# Patient Record
Sex: Male | Born: 1940 | Race: White | Hispanic: No | Marital: Married | Smoking: Former smoker
Health system: Southern US, Community
[De-identification: ages and names within clinical notes are randomized; demographics above are authoritative.]

## PROBLEM LIST (undated history)

## (undated) DIAGNOSIS — N289 Disorder of kidney and ureter, unspecified: Secondary | ICD-10-CM

## (undated) DIAGNOSIS — J449 Chronic obstructive pulmonary disease, unspecified: Secondary | ICD-10-CM

## (undated) DIAGNOSIS — I10 Essential (primary) hypertension: Secondary | ICD-10-CM

## (undated) DIAGNOSIS — I639 Cerebral infarction, unspecified: Secondary | ICD-10-CM

## (undated) DIAGNOSIS — E119 Type 2 diabetes mellitus without complications: Secondary | ICD-10-CM

## (undated) DIAGNOSIS — H9190 Unspecified hearing loss, unspecified ear: Secondary | ICD-10-CM

## (undated) DIAGNOSIS — I4891 Unspecified atrial fibrillation: Secondary | ICD-10-CM

## (undated) DIAGNOSIS — M1990 Unspecified osteoarthritis, unspecified site: Secondary | ICD-10-CM

## (undated) DIAGNOSIS — K219 Gastro-esophageal reflux disease without esophagitis: Secondary | ICD-10-CM

## (undated) HISTORY — DX: Type 2 diabetes mellitus without complications: E11.9

## (undated) HISTORY — DX: Cerebral infarction, unspecified: I63.9

## (undated) HISTORY — DX: Chronic obstructive pulmonary disease, unspecified: J44.9

## (undated) HISTORY — DX: Disorder of kidney and ureter, unspecified: N28.9

## (undated) HISTORY — DX: Unspecified atrial fibrillation: I48.91

## (undated) HISTORY — PX: FEMORAL HERNIA REPAIR: SHX632

## (undated) HISTORY — DX: Unspecified osteoarthritis, unspecified site: M19.90

---

## 2000-02-20 ENCOUNTER — Encounter: Admission: RE | Admit: 2000-02-20 | Discharge: 2000-03-06 | Payer: Self-pay | Admitting: *Deleted

## 2008-07-10 ENCOUNTER — Ambulatory Visit: Payer: Self-pay | Admitting: Cardiology

## 2008-07-16 ENCOUNTER — Encounter: Payer: Self-pay | Admitting: Cardiology

## 2009-04-15 ENCOUNTER — Encounter: Payer: Self-pay | Admitting: Cardiology

## 2009-04-15 ENCOUNTER — Ambulatory Visit: Payer: Self-pay | Admitting: Cardiology

## 2009-04-16 ENCOUNTER — Encounter: Payer: Self-pay | Admitting: Cardiology

## 2009-04-17 ENCOUNTER — Encounter: Payer: Self-pay | Admitting: Cardiology

## 2009-04-18 ENCOUNTER — Encounter: Payer: Self-pay | Admitting: Cardiology

## 2009-04-19 ENCOUNTER — Encounter: Payer: Self-pay | Admitting: Cardiology

## 2009-04-21 ENCOUNTER — Encounter: Payer: Self-pay | Admitting: Cardiology

## 2009-05-10 ENCOUNTER — Inpatient Hospital Stay (HOSPITAL_COMMUNITY): Admission: AD | Admit: 2009-05-10 | Discharge: 2009-05-17 | Payer: Self-pay | Admitting: Cardiology

## 2009-05-10 ENCOUNTER — Encounter (INDEPENDENT_AMBULATORY_CARE_PROVIDER_SITE_OTHER): Payer: Self-pay | Admitting: *Deleted

## 2009-05-10 ENCOUNTER — Encounter: Payer: Self-pay | Admitting: Nurse Practitioner

## 2009-05-10 ENCOUNTER — Ambulatory Visit: Payer: Self-pay | Admitting: Cardiology

## 2009-05-10 ENCOUNTER — Encounter: Payer: Self-pay | Admitting: Emergency Medicine

## 2009-05-17 ENCOUNTER — Encounter: Payer: Self-pay | Admitting: Nurse Practitioner

## 2009-05-20 ENCOUNTER — Ambulatory Visit: Payer: Self-pay | Admitting: Cardiology

## 2009-05-24 ENCOUNTER — Encounter: Payer: Self-pay | Admitting: Cardiology

## 2009-05-27 ENCOUNTER — Telehealth: Payer: Self-pay | Admitting: Cardiology

## 2009-05-27 ENCOUNTER — Encounter: Payer: Self-pay | Admitting: Cardiology

## 2009-05-27 LAB — CONVERTED CEMR LAB: POC INR: 2.5

## 2009-05-30 ENCOUNTER — Encounter: Payer: Self-pay | Admitting: Cardiology

## 2009-06-03 ENCOUNTER — Encounter: Payer: Self-pay | Admitting: Cardiology

## 2009-06-03 ENCOUNTER — Telehealth: Payer: Self-pay | Admitting: Cardiology

## 2009-06-03 LAB — CONVERTED CEMR LAB: POC INR: 2

## 2009-06-04 ENCOUNTER — Encounter: Payer: Self-pay | Admitting: Cardiology

## 2009-06-10 ENCOUNTER — Encounter (INDEPENDENT_AMBULATORY_CARE_PROVIDER_SITE_OTHER): Payer: Self-pay | Admitting: *Deleted

## 2009-06-10 ENCOUNTER — Ambulatory Visit: Payer: Self-pay | Admitting: Cardiology

## 2009-06-10 ENCOUNTER — Telehealth: Payer: Self-pay | Admitting: Cardiology

## 2009-06-10 ENCOUNTER — Encounter: Payer: Self-pay | Admitting: Cardiology

## 2009-06-10 DIAGNOSIS — E782 Mixed hyperlipidemia: Secondary | ICD-10-CM

## 2009-06-10 DIAGNOSIS — I5022 Chronic systolic (congestive) heart failure: Secondary | ICD-10-CM

## 2009-06-10 DIAGNOSIS — E119 Type 2 diabetes mellitus without complications: Secondary | ICD-10-CM

## 2009-06-10 DIAGNOSIS — I4891 Unspecified atrial fibrillation: Secondary | ICD-10-CM

## 2009-06-10 LAB — CONVERTED CEMR LAB: POC INR: 2.4

## 2009-06-17 ENCOUNTER — Telehealth: Payer: Self-pay | Admitting: Cardiology

## 2009-06-17 ENCOUNTER — Encounter: Payer: Self-pay | Admitting: Cardiology

## 2009-06-21 ENCOUNTER — Telehealth (INDEPENDENT_AMBULATORY_CARE_PROVIDER_SITE_OTHER): Payer: Self-pay | Admitting: *Deleted

## 2009-07-01 ENCOUNTER — Ambulatory Visit: Payer: Self-pay | Admitting: Cardiology

## 2009-07-11 ENCOUNTER — Encounter: Payer: Self-pay | Admitting: Cardiology

## 2009-08-05 ENCOUNTER — Ambulatory Visit: Payer: Self-pay | Admitting: Cardiology

## 2009-08-05 LAB — CONVERTED CEMR LAB: POC INR: 1.9

## 2009-08-25 ENCOUNTER — Encounter (INDEPENDENT_AMBULATORY_CARE_PROVIDER_SITE_OTHER): Payer: Self-pay | Admitting: *Deleted

## 2009-09-15 ENCOUNTER — Encounter (INDEPENDENT_AMBULATORY_CARE_PROVIDER_SITE_OTHER): Payer: Self-pay | Admitting: Pharmacist

## 2009-09-26 ENCOUNTER — Ambulatory Visit: Payer: Self-pay | Admitting: Cardiology

## 2009-09-26 ENCOUNTER — Encounter: Payer: Self-pay | Admitting: Cardiology

## 2009-09-26 DIAGNOSIS — N189 Chronic kidney disease, unspecified: Secondary | ICD-10-CM

## 2009-09-27 ENCOUNTER — Encounter: Payer: Self-pay | Admitting: Cardiology

## 2009-10-10 ENCOUNTER — Encounter: Payer: Self-pay | Admitting: Cardiology

## 2009-10-28 ENCOUNTER — Ambulatory Visit: Payer: Self-pay | Admitting: Cardiology

## 2009-10-28 LAB — CONVERTED CEMR LAB: POC INR: 1.7

## 2009-11-25 ENCOUNTER — Ambulatory Visit: Payer: Self-pay | Admitting: Cardiology

## 2009-12-13 ENCOUNTER — Telehealth (INDEPENDENT_AMBULATORY_CARE_PROVIDER_SITE_OTHER): Payer: Self-pay | Admitting: *Deleted

## 2009-12-16 ENCOUNTER — Ambulatory Visit: Payer: Self-pay | Admitting: Cardiology

## 2009-12-30 ENCOUNTER — Ambulatory Visit: Payer: Self-pay | Admitting: Cardiology

## 2010-01-20 ENCOUNTER — Ambulatory Visit: Payer: Self-pay | Admitting: Cardiology

## 2010-01-20 LAB — CONVERTED CEMR LAB: POC INR: 2.2

## 2010-02-17 ENCOUNTER — Ambulatory Visit: Payer: Self-pay | Admitting: Cardiology

## 2010-02-17 LAB — CONVERTED CEMR LAB: POC INR: 1.9

## 2010-03-17 ENCOUNTER — Ambulatory Visit: Payer: Self-pay

## 2010-03-30 ENCOUNTER — Ambulatory Visit
Admission: RE | Admit: 2010-03-30 | Discharge: 2010-03-30 | Payer: Self-pay | Source: Home / Self Care | Attending: Cardiology | Admitting: Cardiology

## 2010-03-30 ENCOUNTER — Encounter: Payer: Self-pay | Admitting: Cardiology

## 2010-03-30 ENCOUNTER — Encounter (INDEPENDENT_AMBULATORY_CARE_PROVIDER_SITE_OTHER): Payer: Self-pay | Admitting: *Deleted

## 2010-04-05 ENCOUNTER — Encounter: Payer: Self-pay | Admitting: Cardiology

## 2010-04-14 ENCOUNTER — Ambulatory Visit
Admission: RE | Admit: 2010-04-14 | Discharge: 2010-04-14 | Payer: Self-pay | Source: Home / Self Care | Attending: Cardiology | Admitting: Cardiology

## 2010-04-18 NOTE — Letter (Signed)
Summary: [facility] D/C DR. HASANAJ  [facility] D/C DR. HASANAJ   Imported By: Zachary George 05/20/2009 15:36:50  _____________________________________________________________________  External Attachment:    Type:   Image     Comment:   External Document

## 2010-04-18 NOTE — Medication Information (Signed)
Summary: Coumadin Clinic  Anticoagulant Therapy  Managed by: Cyril Loosen, RN PCP: Dr. Lia Hopping Supervising MD: Andee Lineman MD, Michelle Piper Indication 1: Atrial Fibrillation Indication 2: Cerebrovascular Accident Lab Used: [facility] [place] Site: [place] INR POC 2.2  Dietary changes: no    Health status changes: no    Bleeding/hemorrhagic complications: no    Recent/future hospitalizations: no    Any changes in medication regimen? no    Recent/future dental: no  Any missed doses?: no       Is patient compliant with meds? yes       Allergies: No Known Drug Allergies  Anticoagulation Management History:      The patient is taking warfarin and comes in today for a routine follow up visit.  He is being anticoagulated due to Atrial Fibrillation .  Positive risk factors for bleeding include an age of 68 years or older, history of CVA/TIA, and presence of serious comorbidities.  Negative risk factors for bleeding include no history of GI bleeding.  The bleeding index is 'high risk'.  Positive CHADS2 values include History of CHF, History of HTN, History of Diabetes, and Prior Stroke/CVA/TIA.  Negative CHADS2 values include Age > 41 years old.  The start date was 05/17/2009.  Anticoagulation responsible provider: Andee Lineman MD, Michelle Piper.  INR POC: 2.2.  Cuvette Lot#: [id number].    Anticoagulation Management Assessment/Plan:      The patient's current anticoagulation dose is Warfarin sodium 5 mg tabs: Use as directed by Anticoagulation Clinic.  The target INR is 2.0-3.0.  The next INR is due 10/28/2009.  Anticoagulation instructions were given to patient.  Results were reviewed/authorized by Cyril Loosen, RN.  He was notified by Cyril Loosen, RN.         Prior Anticoagulation Instructions: INR 1.9 Increase coumadin to 2.5mg  once daily except 5mg  on M,W,F  Current Anticoagulation Instructions: INR 2.2 Continue coumadin 2.5mg  once daily except 5mg  on M,W,F

## 2010-04-18 NOTE — Assessment & Plan Note (Signed)
Summary: 3 MO F/U PER REMINDER/INR check-JM   Visit Type:  Follow-up Primary Provider:  Dr. Lia Hopping   History of Present Illness: 70 year old male presents for followup. He was last seen back in March. He missed his last visit with [place], although has apparently seen Dr. Olena Leatherwood. He has also had home health nursing services.  Mr. Trivedi denies any significant palpitations or change in baseline shortness of breath. He ambulates with a walker. Denies any falls or bleeding problems on Coumadin.  He has only occasional, fleeting, atypical chest pain. Not clearly anginal. Also intermittent lower extremity edema that is dependent in nature.  Not aware of any recent followup lab work. Lipids have not been assessed recently.  Preventive Screening-Counseling & Management  Alcohol-Tobacco     Smoking Status: quit     Year Quit: 04/2009     Cans of tobacco/week: chew 1pk/wk     Tobacco Counseling: to quit use of tobacco products  Current Medications (verified): 1)  Warfarin Sodium 5 Mg Tabs (Warfarin Sodium) .... Use As Directed By Anticoagulation Clinic 2)  Amaryl 2 Mg Tabs (Glimepiride) .... Take 1 Tablet By Mouth Once A Day 3)  Digoxin 0.125 Mg Tabs (Digoxin) .... Take 1 Tablet By Mouth Once A Day 4)  Diltiazem Hcl Er Beads 180 Mg Xr24h-Cap (Diltiazem Hcl Er Beads) .... Take 1 Tablet By Mouth Once A Day 5)  Metoprolol Succinate 100 Mg Xr24h-Tab (Metoprolol Succinate) .... Take 1 Tablet By Mouth Two Times A Day 6)  Potassium Chloride Crys Cr 20 Meq Cr-Tabs (Potassium Chloride Crys Cr) .... Take 1 Tablet By Mouth Once A Day 7)  Furosemide 40 Mg Tabs (Furosemide) .... Take 2 By Mouth in Am and 1 in Pm 8)  Benazepril Hcl 20 Mg Tabs (Benazepril Hcl) .... Take 1 Tablet By Mouth Once A Day 9)  Hydrocodone-Acetaminophen 10-500 Mg Tabs (Hydrocodone-Acetaminophen) .... Take 1 Tablet By Mouth Three Times A Day As Needed  Allergies (verified): No Known Drug Allergies  Comments:  Nurse/Medical  Assistant: The patient is currently on medications but does not know the name or dosage at this time. Instructed to contact our office with details. Will update medication list at that time.  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  The patient denies anorexia, weight gain, chest pain, syncope, prolonged cough, melena, and hematochezia.         Arthritic shoulder and leg discomfort. Otherwise reviewed and negative except as outlined.  Vital Signs:  Patient profile:   70 year old male Height:      73 inches Weight:      258 pounds Pulse rate:   78 / minute BP sitting:   155 / 83  (left arm) Cuff size:   large  Vitals Entered By: Carlye Grippe (September 26, 2009 3:12 PM)  Physical Exam  Additional Exam:  Obese male in no acute distress, ambulates with a walker. HEENT: Conjunctivae and lids normal, oropharynx with poor dentition. Neck: Supple, no loud bruits. Lungs: Diminished breath sounds throughout with scattered rhonchi, no active wheezing but prolonged expiratory  phase. Cardiac: Irregularly irregular, distant, no S3. Abdomen: Soft, nontender. Extremities: Chronic appearing edema, trace to one plus, right greater than left. Distal pulses 1+. Skin: Some venous stasis distally. Neuropsychiatric: Alert and oriented x3, hard of hearing, affect appropriate.   Impression & Recommendations:  Problem # 1:  ATRIAL FIBRILLATION (ICD-427.31)  Persistent atrial fibrillation, largely asymptomatic at this point, rate adequately controlled  today. Continues on Coumadin. Followup in 6 months.  His updated medication list for this problem includes:    Warfarin Sodium 5 Mg Tabs (Warfarin sodium) ..... Use as directed by anticoagulation clinic    Digoxin 0.125 Mg Tabs (Digoxin) .Marland Kitchen... Take 1 tablet by mouth once a day    Metoprolol Succinate 100 Mg Xr24h-tab (Metoprolol succinate) .Marland Kitchen... Take 1 tablet by mouth two times a day  Problem # 2:  MIXED HYPERLIPIDEMIA (ICD-272.2)  Needs followup lipid profile liver function tests. Can discuss statin therapy, particularly with concurrent diabetes mellitus.  Problem # 3:  CHRONIC SYSTOLIC HEART FAILURE (ICD-428.22)  Intermittent lower extremity edema, although no progression, and breathing status stable. No change in diuretic regimen. Weight is actually down 2 pounds from last visit.  His updated medication list for this problem includes:    Warfarin Sodium 5 Mg Tabs (Warfarin sodium) ..... Use as directed by anticoagulation clinic    Digoxin 0.125 Mg Tabs (Digoxin) .Marland Kitchen... Take 1 tablet by mouth once a day    Diltiazem Hcl Er Beads 180 Mg Xr24h-cap (Diltiazem hcl er beads) .Marland Kitchen... Take 1 tablet by mouth once a day    Metoprolol Succinate 100 Mg Xr24h-tab (Metoprolol succinate) .Marland Kitchen... Take 1 tablet by mouth two times a day    Furosemide 40 Mg Tabs (Furosemide) .Marland Kitchen... Take 2 by mouth in am and 1 in pm    Benazepril Hcl 20 Mg Tabs (Benazepril hcl) .Marland Kitchen... Take 1 tablet by mouth once a day  Orders: T-Basic Metabolic Panel [phone]) T-Lipid Profile [id number]) T-Hepatic Function [phone])  His updated medication list for this problem includes:    Warfarin Sodium 5 Mg Tabs (Warfarin sodium) ..... Use as directed by anticoagulation clinic    Digoxin 0.125 Mg Tabs (Digoxin) .Marland Kitchen... Take 1 tablet by mouth once a day    Diltiazem Hcl Er Beads 180 Mg Xr24h-cap (Diltiazem hcl er beads) .Marland Kitchen... Take 1 tablet by mouth once a day    Metoprolol Succinate 100 Mg Xr24h-tab (Metoprolol succinate) .Marland Kitchen...  Take 1 tablet by mouth two times a day    Furosemide 40 Mg Tabs (Furosemide) .Marland Kitchen... Take 2 by mouth in am and 1 in pm    Benazepril Hcl 20 Mg Tabs (Benazepril hcl) .Marland Kitchen... Take 1 tablet by mouth once a day  Problem # 4:  RENAL INSUFFICIENCY, CHRONIC (ICD-585.9)  Followup BMET.  Patient Instructions: 1)  Your physician wants you to follow-up in: 6 months. You will receive a reminder letter in the mail one-two months in advance. If you don't receive a letter, please call our office to schedule the follow-up appointment. 2)  Your physician recommends that you go to the [facility] for lab work. Do not eat or drink after midnight. If the results of your test are normal or stable, you will receive a letter. If they are abnormal, the nurse will contact you by phone.  3)  Your physician recommends that you continue on your current medications as directed. Please refer to the Current Medication list given to you today.

## 2010-04-18 NOTE — Consult Note (Signed)
Summary: CARDIOLOGY CONSULT/ [facility]  CARDIOLOGY CONSULT/ [facility]   Imported By: Zachary George 05/20/2009 15:45:36  _____________________________________________________________________  External Attachment:    Type:   Image     Comment:   External Document

## 2010-04-18 NOTE — Progress Notes (Signed)
Summary: edema  Phone Note Call from Patient   Summary of Call: Having problems with edema in both feet.  Started last p.m.  No breathing difficulty.  Will be seeing his PMD (Hasanaj) on Thursday.   Hoover Brunette, LPN  June 21, 1476 9:10 AM   Follow-up for Phone Call        Would have him elevate feet and follow daily weight - could take additional Lasix if edema and weight increasing.  Keep visit with Dr. Olena Leatherwood this week. Follow-up by: Loreli Slot, MD, [facility],  June 21, 2009 2:15 PM  Additional Follow-up for Phone Call Additional follow up Details #1::        Patient notified.    Additional Follow-up by: Hoover Brunette, LPN,  June 21, 2009 3:11 PM

## 2010-04-18 NOTE — Progress Notes (Signed)
Summary: MISSED COUMADIN DOSE  Phone Note Call from Patient Call back at Home Phone [phone]   Caller: Patient Call For: nurse Summary of Call: message left on voicemail on yesterday at 4:14pm to call. Nurse returned call and patient stated that he missed taking a dose of coumadin and wanted to know if its okay. Nurse informed patient that its very important to take his medication as prescribed in order to keep INR therapeutic. Nurse informed patient not to skip doses. Patient verbalized  understanding.   Initial call taken by: Carlye Grippe,  December 13, 2009 9:05 AM  Follow-up for Phone Call        Reviewed Follow-up by: Vashti Hey RN,  December 13, 2009 9:20 AM

## 2010-04-18 NOTE — Letter (Signed)
Summary: [facility] - Delinquent Coumadin 1  [facility]  [address], [place] [address]   Phone: [phone]  Fax: [phone]     September 15, 2009 MRN: [id number]   Nathan Zimmerman [address] [place], [place]  [address]   Dear [name],  This letter is being sent to you as a reminder that it is necessary for you to get your INR/PT checked regularly so that we can optimize your care.  Our records indicate that you were scheduled to have a test done recently.  As of today, we have not received the results of this test.  It is very important that you have your INR checked.  Please call our office at the number listed above to schedule an appointment at your earliest convenience.    If you have recently had your protime checked or have discontinued this medication, please contact our office at the above phone number to clarify this issue.  Thank you for this prompt attention to this important health care matter.  Sincerely, Vashti Hey RN  [facility]ovascular [facility] Reduction [facility] Team

## 2010-04-18 NOTE — Medication Information (Signed)
Summary: ccr-lr  Anticoagulant Therapy  Managed by: Vashti Hey, RN PCP: Dr. Lia Hopping Supervising MD: Diona Browner MD, Remi Deter Indication 1: Atrial Fibrillation Indication 2: Cerebrovascular Accident Lab Used: [facility] [place] Site: [place] INR POC 1.9  Dietary changes: no    Health status changes: no    Bleeding/hemorrhagic complications: no    Recent/future hospitalizations: no    Any changes in medication regimen? no    Recent/future dental: no  Any missed doses?: no       Is patient compliant with meds? yes       Allergies: No Known Drug Allergies  Anticoagulation Management History:      The patient is taking warfarin for Atrial Fibrillation .  Positive risk factors for bleeding include an age of 32 years or older, history of CVA/TIA, and presence of serious comorbidities.  Negative risk factors for bleeding include no history of GI bleeding.  The bleeding index is 'high risk'.  Positive CHADS2 values include History of CHF, History of HTN, History of Diabetes, and Prior Stroke/CVA/TIA.  Negative CHADS2 values include Age > 37 years old.  The start date was 05/17/2009.  Anticoagulation responsible provider: Diona Browner MD, Remi Deter.  INR POC: 1.9.    Anticoagulation Management Assessment/Plan:      The patient's current anticoagulation dose is Warfarin sodium 5 mg tabs: Use as directed by Anticoagulation Clinic.  The target INR is 2.0-3.0.  The next INR is due 08/25/2009.  Anticoagulation instructions were given to patient.  Results were reviewed/authorized by Vashti Hey, RN.  He was notified by Vashti Hey RN.         Prior Anticoagulation Instructions: INR 1.7 Take coumadin 2 tablets tonight then increase dose to 2.5mg  once daily except 5mg  on Mondays and Fridays  Current Anticoagulation Instructions: INR 1.9 Increase coumadin to 2.5mg  once daily except 5mg  on M,W,F

## 2010-04-18 NOTE — Letter (Signed)
Summary: [facility] [employer]	[place]. [address]   [place], [place] [address]   Phone: [phone]  Fax: [phone]     June 10, 2009    Nathan Zimmerman   The above named patient had a medical visit today at:   3:20  pm.  [name], [name] wife, was with him at this appointment.  Please take this into consideration when reviewing the time away from work/school.      Sincerely yours,  Architectural technologist

## 2010-04-18 NOTE — Medication Information (Signed)
Summary: ccr-lr  Anticoagulant Therapy  Managed by: Vashti Hey, RN PCP: Dr. Lia Hopping Supervising MD: Andee Lineman MD, Michelle Piper Indication 1: Atrial Fibrillation Indication 2: Cerebrovascular Accident Lab Used: [facility]  Site: [place] INR POC 1.7  Dietary changes: no    Health status changes: no    Bleeding/hemorrhagic complications: no    Recent/future hospitalizations: no    Any changes in medication regimen? no    Recent/future dental: no  Any missed doses?: no       Is patient compliant with meds? yes       Allergies: No Known Drug Allergies  Anticoagulation Management History:      The patient is taking warfarin and comes in today for a routine follow up visit.  The patient is taking warfarin for Atrial Fibrillation .  Positive risk factors for bleeding include an age of 19 years or older, history of CVA/TIA, and presence of serious comorbidities.  Negative risk factors for bleeding include no history of GI bleeding.  The bleeding index is 'high risk'.  Positive CHADS2 values include History of CHF, History of HTN, History of Diabetes, and Prior Stroke/CVA/TIA.  Negative CHADS2 values include Age > 12 years old.  The start date was 05/17/2009.  Anticoagulation responsible provider: Andee Lineman MD, Michelle Piper.  INR POC: 1.7.  Cuvette Lot#: [id number].    Anticoagulation Management Assessment/Plan:      The patient's current anticoagulation dose is Warfarin sodium 5 mg tabs: Use as directed by Anticoagulation Clinic.  The target INR is 2.0-3.0.  The next INR is due 07/22/2009.  Anticoagulation instructions were given to patient.  Results were reviewed/authorized by Vashti Hey, RN.  He was notified by Vashti Hey RN.         Prior Anticoagulation Instructions: INR 1.4 Home Health nurse states pt has only been taking 2.5mg  once daily.  Order given for pt to take coumadin 5mg  x 2 then resume 2.5mg  once daily except 5mg  on Fridays as previously ordered.  [facility] to recheck INR on  07/01/09 and call results to office.  Current Anticoagulation Instructions: INR 1.7 Take coumadin 2 tablets tonight then increase dose to 2.5mg  once daily except 5mg  on Mondays and Fridays

## 2010-04-18 NOTE — Miscellaneous (Signed)
Summary: Home Care Report/ [facility]  Home Care Report/ [facility]   Imported By: Dorise Hiss 06/08/2009 09:18:10  _____________________________________________________________________  External Attachment:    Type:   Image     Comment:   External Document

## 2010-04-18 NOTE — Miscellaneous (Signed)
Summary: Home Care Report/ [facility]  Home Care Report/ [facility]   Imported By: Dorise Hiss 07/11/2009 08:21:03  _____________________________________________________________________  External Attachment:    Type:   Image     Comment:   External Document

## 2010-04-18 NOTE — Medication Information (Signed)
Summary: ccr-lr  Anticoagulant Therapy  Managed by: Vashti Hey, RN PCP: Dr. Lia Hopping Supervising MD: Andee Lineman MD, Michelle Piper Indication 1: Atrial Fibrillation Indication 2: Cerebrovascular Accident Lab Used: [facility] [place] Site: [place] INR POC 2.2  Dietary changes: no    Health status changes: no    Bleeding/hemorrhagic complications: no    Recent/future hospitalizations: no    Any changes in medication regimen? no    Recent/future dental: no  Any missed doses?: no       Is patient compliant with meds? yes       Allergies: No Known Drug Allergies  Anticoagulation Management History:      The patient is taking warfarin and comes in today for a routine follow up visit.  He is being anticoagulated because of Atrial Fibrillation .  Positive risk factors for bleeding include an age of 68 years or older, history of CVA/TIA, and presence of serious comorbidities.  Negative risk factors for bleeding include no history of GI bleeding.  The bleeding index is 'high risk'.  Positive CHADS2 values include History of CHF, History of HTN, History of Diabetes, and Prior Stroke/CVA/TIA.  Negative CHADS2 values include Age > 94 years old.  The start date was 05/17/2009.  Anticoagulation responsible Javian Nudd: Andee Lineman MD, Michelle Piper.  INR POC: 2.2.  Cuvette Lot#: [id number].    Anticoagulation Management Assessment/Plan:      The patient's current anticoagulation dose is Warfarin sodium 5 mg tabs: Use as directed by Anticoagulation Clinic.  The target INR is 2.0-3.0.  The next INR is due 02/17/2010.  Anticoagulation instructions were given to patient.  Results were reviewed/authorized by Vashti Hey, RN.  He was notified by Vashti Hey RN.         Prior Anticoagulation Instructions: INR 2.6 Continue coumadin 5mg  once daily except 2.5mg  on S,T,Th  Current Anticoagulation Instructions: INR 2.2 Continue coumadin 5mg  once daily except 2.5mg  on S,T,Th  Pt was seen in office today and wife had to  leave work early to bring him to this appt.

## 2010-04-18 NOTE — Medication Information (Signed)
Summary: [facility]-LAST CHECK 09/26/09-JM  Anticoagulant Therapy  Managed by: Vashti Hey, RN PCP: Dr. Lia Hopping Supervising MD: Myrtis Ser MD, Tinnie Gens Indication 1: Atrial Fibrillation Indication 2: Cerebrovascular Accident Lab Used: [facility] [place] Site: [place] INR POC 1.7  Dietary changes: no    Health status changes: no    Bleeding/hemorrhagic complications: no    Recent/future hospitalizations: no    Any changes in medication regimen? no    Recent/future dental: no  Any missed doses?: no       Is patient compliant with meds? yes       Allergies: No Known Drug Allergies  Anticoagulation Management History:      The patient is taking warfarin and comes in today for a routine follow up visit.  He is being anticoagulated due to Atrial Fibrillation .  Positive risk factors for bleeding include an age of 70 years or older, history of CVA/TIA, and presence of serious comorbidities.  Negative risk factors for bleeding include no history of GI bleeding.  The bleeding index is 'high risk'.  Positive CHADS2 values include History of CHF, History of HTN, History of Diabetes, and Prior Stroke/CVA/TIA.  Negative CHADS2 values include Age > 48 years old.  The start date was 05/17/2009.  Anticoagulation responsible Damel Querry: Myrtis Ser MD, Tinnie Gens.  INR POC: 1.7.  Cuvette Lot#: [id number].    Anticoagulation Management Assessment/Plan:      The patient's current anticoagulation dose is Warfarin sodium 5 mg tabs: Use as directed by Anticoagulation Clinic.  The target INR is 2.0-3.0.  The next INR is due 11/18/2009.  Anticoagulation instructions were given to patient.  Results were reviewed/authorized by Vashti Hey, RN.  He was notified by Vashti Hey RN.         Prior Anticoagulation Instructions: INR 2.2 Continue coumadin 2.5mg  once daily except 5mg  on M,W,F  Current Anticoagulation Instructions: INR 1.7 Increase coumadin to 5mg  once daily except 2.5mg  on S,T,Th

## 2010-04-18 NOTE — Letter (Signed)
Summary: Appointment - Missed  [facility]  [address] [place] [address]   Phone: [phone]  Fax: [phone]     August 25, 2009 MRN: [id number]   TOBY BREITHAUPT [address] [place], [place]  [address]   Dear Mr. Hartshorn,  Our records indicate you missed your appointment on          08/25/09 coumadin clinic                      It is very important that we reach you to reschedule this appointment. We look forward to participating in your health care needs. Please contact us at the number listed above at your earliest convenience to reschedule this appointment.     Sincerely,    Glass blower/designer

## 2010-04-18 NOTE — Progress Notes (Signed)
Summary: coumadin management  Phone Note Other Incoming   Caller: Lucrezia Europe RN [facility] Reason for Call: Discuss lab or test results Summary of Call: Called with results of INR obtained on pt today.  INR 2.0  Order given for pt to increase coumadin to 2.5mg  once daily except 5mg  on Fridays and [facility] to recheck INR on 06/10/09 and call to the office. Initial call taken by: Vashti Hey RN,  June 03, 2009 1:16 PM    New/Updated Medications: WARFARIN SODIUM 5 MG TABS (WARFARIN SODIUM) Use as directed by Anticoagulation Clinic  Anticoagulant Therapy  Managed by: Vashti Hey, RN Supervising MD: Myrtis Ser MD, Tinnie Gens Indication 1: Atrial Fibrillation Indication 2: Cerebrovascular Accident Lab Used: [facility] [place] Site: [place] INR POC 2.0  Dietary changes: no    Health status changes: no    Bleeding/hemorrhagic complications: no    Recent/future hospitalizations: no    Any changes in medication regimen? no    Recent/future dental: no  Any missed doses?: no       Is patient compliant with meds? yes         Anticoagulation Management History:      His anticoagulation is being managed by telephone today.  The patient is taking warfarin for Atrial Fibrillation .  Positive risk factors for bleeding include an age of 17 years or older, history of CVA/TIA, and presence of serious comorbidities.  Negative risk factors for bleeding include no history of GI bleeding.  The bleeding index is 'high risk'.  Positive CHADS2 values include History of CHF, History of HTN, History of Diabetes, and Prior Stroke/CVA/TIA.  Negative CHADS2 values include Age > 66 years old.  The start date was 05/17/2009.  Anticoagulation responsible provider: Myrtis Ser MD, Tinnie Gens.  INR POC: 2.0.    Anticoagulation Management Assessment/Plan:      The patient's current anticoagulation dose is Warfarin sodium 5 mg tabs: Use as directed by Anticoagulation Clinic.  The target INR is 2.0-3.0.  The next INR is due  06/10/2009.  Anticoagulation instructions were given to Lucrezia Europe RN [facility].  Results were reviewed/authorized by Vashti Hey, RN.  He was notified by Lucrezia Europe RN [facility].         Prior Anticoagulation Instructions: INR 2.5 Continue coumadin 2.5mg  once daily   Current Anticoagulation Instructions: Called with results of INR obtained on pt today.  INR 2.0  Order given for pt to increase coumadin to 2.5mg  once daily except 5mg  on Fridays and [facility] to recheck INR on 06/10/09 and call to the office.

## 2010-04-18 NOTE — Miscellaneous (Signed)
Summary: Home Care Report/ [facility]  Home Care Report/ [facility]   Imported By: Dorise Hiss 05/30/2009 10:27:30  _____________________________________________________________________  External Attachment:    Type:   Image     Comment:   External Document

## 2010-04-18 NOTE — Medication Information (Signed)
Summary: ccr-lr  Anticoagulant Therapy  Managed by: Vashti Hey, RN PCP: Dr. Lia Hopping Supervising MD: Diona Browner MD, Remi Deter Indication 1: Atrial Fibrillation Indication 2: Cerebrovascular Accident Lab Used: [facility]  Site: [place] INR POC 1.6  Dietary changes: no    Health status changes: no    Bleeding/hemorrhagic complications: no    Recent/future hospitalizations: no    Any changes in medication regimen? no    Recent/future dental: no  Any missed doses?: no       Is patient compliant with meds? yes       Allergies: No Known Drug Allergies  Anticoagulation Management History:      The patient is taking warfarin and comes in today for a routine follow up visit.  He is being anticoagulated because of Atrial Fibrillation .  Positive risk factors for bleeding include an age of 60 years or older, history of CVA/TIA, and presence of serious comorbidities.  Negative risk factors for bleeding include no history of GI bleeding.  The bleeding index is 'high risk'.  Positive CHADS2 values include History of CHF, History of HTN, History of Diabetes, and Prior Stroke/CVA/TIA.  Negative CHADS2 values include Age > 68 years old.  The start date was 05/17/2009.  Anticoagulation responsible provider: Diona Browner MD, Remi Deter.  INR POC: 1.6.  Cuvette Lot#: [id number].    Anticoagulation Management Assessment/Plan:      The patient's current anticoagulation dose is Warfarin sodium 5 mg tabs: Use as directed by Anticoagulation Clinic.  The target INR is 2.0-3.0.  The next INR is due 12/30/2009.  Anticoagulation instructions were given to patient.  Results were reviewed/authorized by Vashti Hey, RN.  He was notified by Vashti Hey RN.         Prior Anticoagulation Instructions: INR 3.4 Decrease coumadin to 2.5mg  once daily except 5mg  on M,W,F  Current Anticoagulation Instructions: INR 1.6 Take coumadin 7.5mg  tonight then increase dose to 5mg  once daily except 2.5mg  on S,T,Th

## 2010-04-18 NOTE — Assessment & Plan Note (Signed)
Summary: POST HOSPITAL F/U [place]   Visit Type:  Follow-up Primary Provider:  Dr. Lia Hopping   History of Present Illness: 70 year old male presents for a followup visit. He is here with his wife today. Nathan Zimmerman indicates that he feels much better in general, without any significant palpitations, and stable dyspnea and exertion which is in the range of NYHA class 2-3. Ambulates with a walker and wears oxygen continuously. Reports compliance with medications, and has only occasional edema, has not manifested any orthopnea or PND. I reviewed his discharge summary from early March. At this point the plan is to treat him with a strategy of heart rate control and anticoagulation, tending to avoid active attempts at cardioversion particularly in light of his severe lung disease and likelihood for recurrent atrial arrhythmias.  Preventive Screening-Counseling & Management  Alcohol-Tobacco     Smoking Status: quit     Packs/Day: chewing tobacco occas.    Current Medications (verified): 1)  Warfarin Sodium 5 Mg Tabs (Warfarin Sodium) .... Use As Directed By Anticoagulation Clinic 2)  Amaryl 2 Mg Tabs (Glimepiride) .... Take 1 Tablet By Mouth Once A Day 3)  Digoxin 0.125 Mg Tabs (Digoxin) .... Take 1 Tablet By Mouth Once A Day 4)  Diltiazem Hcl Er Beads 180 Mg Xr24h-Cap (Diltiazem Hcl Er Beads) .... Take 1 Tablet By Mouth Once A Day 5)  Metoprolol Succinate 100 Mg Xr24h-Tab (Metoprolol Succinate) .... Take 1 Tablet By Mouth Two Times A Day 6)  Potassium Chloride Crys Cr 20 Meq Cr-Tabs (Potassium Chloride Crys Cr) .... Take 1 Tablet By Mouth Once A Day 7)  Furosemide 40 Mg Tabs (Furosemide) .... Take 2 By Mouth in Am and 1 in Pm 8)  Benazepril Hcl 20 Mg Tabs (Benazepril Hcl) .... Take 1 Tablet By Mouth Once A Day 9)  Hydrocodone-Acetaminophen 10-500 Mg Tabs (Hydrocodone-Acetaminophen) .... 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    Review of Systems  The patient denies anorexia, fever, weight loss, chest pain, syncope, prolonged cough, melena, hematochezia, and severe indigestion/heartburn.         Otherwise review negative except as outlined above.  Vital Signs:  Patient profile:   70 year old male Height:      73 inches Weight:      260.75 pounds BMI:     34.53 O2 Sat:      98 % on 6 L/min Pulse rate:   70 / minute BP sitting:   121 / 74  (left arm) Cuff size:   regular  Vitals Entered By: Hoover Brunette, LPN (June 10, 2009 3:23 PM)  Nutrition Counseling: Patient's BMI is greater than 25 and therefore counseled on weight management options.  O2 Flow:  6 L/min Is Patient Diabetic? Yes Comments post hospital   Physical Exam  Additional Exam:  Obese male in no acute distress, ambulates with a walker. HEENT: Conjunctivae and lids normal, oropharynx with poor dentition. Neck: Supple, no loud bruits. Lungs: Diminished breath sounds throughout with scattered rhonchi, no active wheezing but prolonged expiratory phase. Cardiac: Irregularly irregular, distant, no S3.  Abdomen: Soft, nontender. Studies: Chronic appearing edema, trace to one plus, right greater than left. Distal pulses 1+.   Impression & Recommendations:  Problem # 1:  ATRIAL FIBRILLATION (ICD-427.31) Assessment Unchanged  Persistent atrial fibrillation, now managed with a strategy of heart rate control and anticoagulation. Patient is symptomatically improved. He continues to follow through the Coumadin clinic. We will see him back in the next  3 months.  His updated medication list for this problem includes:    Warfarin Sodium 5 Mg Tabs (Warfarin sodium) ..... Use as directed by anticoagulation clinic    Digoxin 0.125 Mg Tabs (Digoxin) .Marland Kitchen... Take 1 tablet by mouth once a day    Metoprolol Succinate 100 Mg Xr24h-tab (Metoprolol succinate) .Marland Kitchen... Take 1 tablet by mouth two times a day  Problem # 2:  MIXED HYPERLIPIDEMIA (ICD-272.2)  Followed by Dr. Olena Leatherwood. Presently not on statin therapy.  Problem # 3:  DIABETES MELLITUS, TYPE II (ICD-250.00)  Followed by Dr. Olena Leatherwood.  His updated medication list for this problem includes:    Amaryl 2 Mg Tabs (Glimepiride) .Marland Kitchen... Take 1 tablet by mouth once a day    Benazepril Hcl 20 Mg Tabs (Benazepril hcl) .Marland Kitchen... Take 1 tablet by mouth once a day  Problem # 4:  CHRONIC SYSTOLIC HEART FAILURE (ICD-428.22)  History of mixed cardiomyopathy, LVEF 45% with associated diastolic dysfunction. Patient appears to be fairly euvolemic at this time.  His updated medication list for this problem includes:    Warfarin Sodium 5 Mg Tabs (Warfarin sodium) ..... Use as directed by anticoagulation clinic    Digoxin 0.125 Mg Tabs (Digoxin) .Marland Kitchen... Take 1 tablet by mouth once a day    Diltiazem Hcl Er Beads 180 Mg Xr24h-cap (Diltiazem hcl er beads) .Marland Kitchen... Take 1 tablet by mouth once a day    Metoprolol Succinate 100 Mg Xr24h-tab (Metoprolol succinate) .Marland Kitchen... Take 1 tablet by mouth two times a day    Furosemide 40 Mg Tabs (Furosemide) .Marland Kitchen... Take 2 by mouth in am and 1 in pm    Benazepril Hcl 20 Mg Tabs (Benazepril hcl) .Marland Kitchen... Take 1 tablet by mouth once a day  Patient Instructions: 1)  Your physician wants you to follow-up in: 3 months. You will receive a reminder letter in the mail one-two months in advance. If you don't receive a letter, please call our office to schedule the follow-up appointment. 2)  Your physician recommends that you continue on your current medications as directed. Please refer to the Current  Medication list given to you today.

## 2010-04-18 NOTE — Progress Notes (Signed)
Summary: coumadin management  Phone Note Other Incoming   Caller: Lucrezia Europe RN [facility] Reason for Call: Discuss lab or test results Summary of Call: Called with results of INR obtained on pt today.  INR 2.4   Order given for pt to continue coumadin 2.5mg  once daily except 5mg  on Fridays and [facility] to recheck INR on 06/17/09 and call results to office. Initial call taken by: Vashti Hey RN,  June 10, 2009 11:46 AM     Anticoagulant Therapy  Managed by: Vashti Hey, RN Supervising MD: Andee Lineman MD, Michelle Piper Indication 1: Atrial Fibrillation Indication 2: Cerebrovascular Accident Lab Used: [facility] [place] Site: [place] INR POC 2.4  Dietary changes: no    Health status changes: no    Bleeding/hemorrhagic complications: no    Recent/future hospitalizations: no    Any changes in medication regimen? no    Recent/future dental: no  Any missed doses?: no       Is patient compliant with meds? yes         Anticoagulation Management History:      His anticoagulation is being managed by telephone today.  The patient is taking warfarin for Atrial Fibrillation .  Positive risk factors for bleeding include an age of 73 years or older, history of CVA/TIA, and presence of serious comorbidities.  Negative risk factors for bleeding include no history of GI bleeding.  The bleeding index is 'high risk'.  Positive CHADS2 values include History of CHF, History of HTN, History of Diabetes, and Prior Stroke/CVA/TIA.  Negative CHADS2 values include Age > 82 years old.  The start date was 05/17/2009.  Anticoagulation responsible provider: Andee Lineman MD, Michelle Piper.  INR POC: 2.4.    Anticoagulation Management Assessment/Plan:      The patient's current anticoagulation dose is Warfarin sodium 5 mg tabs: Use as directed by Anticoagulation Clinic.  The target INR is 2.0-3.0.  The next INR is due 06/17/2009.  Anticoagulation instructions were given to Lucrezia Europe RN [facility].  Results were reviewed/authorized by Vashti Hey, RN.  He was notified by Lucrezia Europe RN [facility].         Prior Anticoagulation Instructions: Called with results of INR obtained on pt today.  INR 2.0  Order given for pt to increase coumadin to 2.5mg  once daily except 5mg  on Fridays and [facility] to recheck INR on 06/10/09 and call to the office.  Current Anticoagulation Instructions: Called with results of INR obtained on pt today.  INR 2.4   Order given for pt to continue coumadin 2.5mg  once daily except 5mg  on Fridays and [facility] to recheck INR on 06/17/09 and call results to office.

## 2010-04-18 NOTE — Progress Notes (Signed)
Summary: coumadin management  Phone Note Other Incoming   Caller: Lucrezia Europe RN [facility] Reason for Call: Discuss lab or test results Summary of Call: Called with results of INR obtained on pt today.  INR 2.5  Order given for pt to continue coumadin 2.5mg  once daily and [facility] to recheck INR on 06/03/09. Initial call taken by: Vashti Hey RN,  May 27, 2009 1:33 PM     Anticoagulant Therapy  Managed by: Vashti Hey, RN Supervising MD: Diona Browner MD, Remi Deter Indication 1: Atrial Fibrillation Indication 2: Cerebrovascular Accident Lab Used: [facility] [place] Site: [place] INR POC 2.5  Dietary changes: no    Health status changes: no    Bleeding/hemorrhagic complications: no    Recent/future hospitalizations: no    Any changes in medication regimen? no    Recent/future dental: no  Any missed doses?: no       Is patient compliant with meds? yes         Anticoagulation Management History:      The patient is taking warfarin and comes in today for a routine follow up visit.  The patient is taking warfarin for Atrial Fibrillation .  Positive risk factors for bleeding include an age of 68 years or older, history of CVA/TIA, and presence of serious comorbidities.  Negative risk factors for bleeding include no history of GI bleeding.  The bleeding index is 'high risk'.  Positive CHADS2 values include History of CHF, History of HTN, History of Diabetes, and Prior Stroke/CVA/TIA.  Negative CHADS2 values include Age > 82 years old.  The start date was 05/17/2009.  Anticoagulation responsible provider: Diona Browner MD, Remi Deter.  INR POC: 2.5.  Cuvette Lot#: [id number].    Anticoagulation Management Assessment/Plan:      The target INR is 2.0-3.0.  The next INR is due 06/03/2009.  Anticoagulation instructions were given to Lucrezia Europe RN [facility].  Results were reviewed/authorized by Vashti Hey, RN.  He was notified by Lucrezia Europe RN [facility].         Prior Anticoagulation Instructions: INR 4.5 Hold  coumadin x 2 then decrease couamdin to 2.5mg  once daily     Current Anticoagulation Instructions: INR 2.5 Continue coumadin 2.5mg  once daily

## 2010-04-18 NOTE — Medication Information (Signed)
Summary: ccr-lr  Anticoagulant Therapy  Managed by: Vashti Hey, RN PCP: Dr. Lia Hopping Supervising MD: Andee Lineman MD, Michelle Piper Indication 1: Atrial Fibrillation Indication 2: Cerebrovascular Accident Lab Used: [facility] [place] Site: [place] INR POC 2.6  Dietary changes: no    Health status changes: no    Bleeding/hemorrhagic complications: no    Recent/future hospitalizations: no    Any changes in medication regimen? no    Recent/future dental: no  Any missed doses?: no       Is patient compliant with meds? yes       Allergies: No Known Drug Allergies  Anticoagulation Management History:      The patient is taking warfarin and comes in today for a routine follow up visit.  He is being anticoagulated because of Atrial Fibrillation .  Positive risk factors for bleeding include an age of 68 years or older, history of CVA/TIA, and presence of serious comorbidities.  Negative risk factors for bleeding include no history of GI bleeding.  The bleeding index is 'high risk'.  Positive CHADS2 values include History of CHF, History of HTN, History of Diabetes, and Prior Stroke/CVA/TIA.  Negative CHADS2 values include Age > 69 years old.  The start date was 05/17/2009.  Anticoagulation responsible provider: Andee Lineman MD, Michelle Piper.  INR POC: 2.6.  Cuvette Lot#: [id number].    Anticoagulation Management Assessment/Plan:      The patient's current anticoagulation dose is Warfarin sodium 5 mg tabs: Use as directed by Anticoagulation Clinic.  The target INR is 2.0-3.0.  The next INR is due 01/20/2010.  Anticoagulation instructions were given to patient.  Results were reviewed/authorized by Vashti Hey, RN.  He was notified by Vashti Hey RN.         Prior Anticoagulation Instructions: INR 1.6 Take coumadin 7.5mg  tonight then increase dose to 5mg  once daily except 2.5mg  on S,T,Th  Current Anticoagulation Instructions: INR 2.6 Continue coumadin 5mg  once daily except 2.5mg  on S,T,Th

## 2010-04-18 NOTE — Medication Information (Signed)
Summary: [facility] COUMADIN/[place]  Anticoagulant Therapy  Managed by: Vashti Hey, RN Supervising MD: Andee Lineman MD, Michelle Piper Indication 1: Atrial Fibrillation Indication 2: Cerebrovascular Accident Lab Used: [facility] [place] Site: [place] INR POC 4.5  Dietary changes: no    Health status changes: no    Bleeding/hemorrhagic complications: no    Recent/future hospitalizations: yes       Details: S/P [facility] 05/10/09 - 31/11 for A Fib RVR / CHF  Any changes in medication regimen? yes       Details: started on coumadin 5mg  qd   Recent/future dental: no  Any missed doses?: no       Is patient compliant with meds? yes      Comments: Pt was Started on coumadin in the hospital.  He was discharged home on Coumadin 5mg  once daily.  Discharge INR was 2.52 on 05/17/09.   Cou regularly.madin teaching provided to pt.  Literature given.  Pt verbalized understanding of coumadin schedule and importance of INR checks  Anticoagulation Management History:      The patient comes in today for his initial visit for anticoagulation therapy.  The patient is taking warfarin for Atrial Fibrillation .  Positive risk factors for bleeding include an age of 68 years or older, history of CVA/TIA, and presence of serious comorbidities.  Negative risk factors for bleeding include no history of GI bleeding.  The bleeding index is 'high risk'.  Positive CHADS2 values include History of CHF, History of HTN, History of Diabetes, and Prior Stroke/CVA/TIA.  Negative CHADS2 values include Age > 23 years old.  The start date was 05/17/2009.  Anticoagulation responsible provider: Andee Lineman MD, Michelle Piper.  INR POC: 4.5.  Cuvette Lot#: [id number].    Anticoagulation Management Assessment/Plan:      The next INR is due 05/27/2009.  Anticoagulation instructions were given to patient.  Results were reviewed/authorized by Vashti Hey, RN.  He was notified by Vashti Hey RN.         Current Anticoagulation Instructions: INR 4.5 Hold coumadin x 2  then decrease couamdin to 2.5mg  once daily     Appended Document: [facility] COUMADIN/[place] Received call from Lucrezia Europe RN with [facility].  They are seeing pt and will be checking INR's and calling results to the office as long as pt is being seen by them.

## 2010-04-18 NOTE — Medication Information (Signed)
Summary: ccr-lr  Anticoagulant Therapy  Managed by: Vashti Hey, RN PCP: Dr. Lia Hopping Supervising MD: Diona Browner MD, Remi Deter Indication 1: Atrial Fibrillation Indication 2: Cerebrovascular Accident Lab Used: [facility] [place] Site: [place] INR POC 1.9  Dietary changes: no    Health status changes: no    Bleeding/hemorrhagic complications: no    Recent/future hospitalizations: no    Any changes in medication regimen? no    Recent/future dental: no  Any missed doses?: no       Is patient compliant with meds? yes       Allergies: No Known Drug Allergies  Anticoagulation Management History:      The patient is taking warfarin and comes in today for a routine follow up visit.  Warfarin therapy is being given due to Atrial Fibrillation .  Positive risk factors for bleeding include an age of 2 years or older, history of CVA/TIA, and presence of serious comorbidities.  Negative risk factors for bleeding include no history of GI bleeding.  The bleeding index is 'high risk'.  Positive CHADS2 values include History of CHF, History of HTN, History of Diabetes, and Prior Stroke/CVA/TIA.  Negative CHADS2 values include Age > 72 years old.  The start date was 05/17/2009.  Anticoagulation responsible provider: Diona Browner MD, Remi Deter.  INR POC: 1.9.  Cuvette Lot#: [id number].    Anticoagulation Management Assessment/Plan:      The patient's current anticoagulation dose is Warfarin sodium 5 mg tabs: Use as directed by Anticoagulation Clinic.  The target INR is 2.0-3.0.  The next INR is due 03/17/2010.  Anticoagulation instructions were given to patient.  Results were reviewed/authorized by Vashti Hey, RN.  He was notified by Vashti Hey RN.         Prior Anticoagulation Instructions: INR 2.2 Continue coumadin 5mg  once daily except 2.5mg  on S,T,Th  Pt was seen in office today and wife had to leave work early to bring him to this appt.  Current Anticoagulation Instructions: INR  1.9 Take coumadin 1 1/2 tablets tonight then resume 1 tablet once daily except 1/2 tablet on Sundays, Tuesdays and Thursdays  Please excuse wife from work.  Husband had appt for blood work and she had to bring him.  Vashti Hey RN

## 2010-04-18 NOTE — Progress Notes (Signed)
Summary: coumadin management  Phone Note Call from Patient Call back at [phone]   Caller: lisa with [facility] Call For: [facility] with [facility] Reason for Call: Talk to Nurse Summary of Call: 1.4 inr  2.5 mg daily  [phone] Initial call taken by: Claudette Laws,  June 17, 2009 9:52 AM  Follow-up for Phone Call        Called Thalia Bloodgood RN [facility] bac.   INR 1.4 Home Health nurse states pt has only been taking 2.5mg  once daily.  Order given for pt to take coumadin 5mg  x 2 then resume 2.5mg  once daily except 5mg  on Fridays as previously ordered.  [facility] to recheck INR on 07/01/09 and call results to office. Follow-up by: Vashti Hey RN,  June 17, 2009 10:21 AM      Anticoagulation Management History:      His anticoagulation is being managed by telephone today.  The patient is taking warfarin for Atrial Fibrillation .  Positive risk factors for bleeding include an age of 103 years or older, history of CVA/TIA, and presence of serious comorbidities.  Negative risk factors for bleeding include no history of GI bleeding.  The bleeding index is 'high risk'.  Positive CHADS2 values include History of CHF, History of HTN, History of Diabetes, and Prior Stroke/CVA/TIA.  Negative CHADS2 values include Age > 90 years old.  The start date was 05/17/2009.  Anticoagulation responsible provider: Diona Browner MD, Remi Deter.  INR POC: 1.4.    Anticoagulation Management Assessment/Plan:      The patient's current anticoagulation dose is Warfarin sodium 5 mg tabs: Use as directed by Anticoagulation Clinic.  The target INR is 2.0-3.0.  The next INR is due 07/01/2009.  Anticoagulation instructions were given to Thalia Bloodgood  RN [facility].  Results were reviewed/authorized by Vashti Hey, RN.  He was notified by Thalia Bloodgood  RN [facility].         Prior Anticoagulation Instructions: Called with results of INR obtained on pt today.  INR 2.4   Order given for pt to continue coumadin 2.5mg  once daily except 5mg  on Fridays and [facility]  to recheck INR on 06/17/09 and call results to office.  Current Anticoagulation Instructions: INR 1.4 Home Health nurse states pt has only been taking 2.5mg  once daily.  Order given for pt to take coumadin 5mg  x 2 then resume 2.5mg  once daily except 5mg  on Fridays as previously ordered.  [facility] to recheck INR on 07/01/09 and call results to office.   Anticoagulant Therapy  Managed by: Vashti Hey, RN PCP: Dr. Lia Hopping Supervising MD: Diona Browner MD, Remi Deter Indication 1: Atrial Fibrillation Indication 2: Cerebrovascular Accident Lab Used: [facility]  [place] Site: [place] INR POC 1.4  Dietary changes: no    Health status changes: no    Bleeding/hemorrhagic complications: no    Recent/future hospitalizations: no    Any changes in medication regimen? no    Recent/future dental: no  Any missed doses?: no       Is patient compliant with meds? yes

## 2010-04-18 NOTE — Miscellaneous (Signed)
Summary: Home Care Report/ [facility]  Home Care Report/ [facility]   Imported By: Dorise Hiss 06/08/2009 09:19:29  _____________________________________________________________________  External Attachment:    Type:   Image     Comment:   External Document

## 2010-04-18 NOTE — Miscellaneous (Signed)
Summary: Home Care Report/ [facility]  Home Care Report/ [facility]   Imported By: Dorise Hiss 05/24/2009 11:25:54  _____________________________________________________________________  External Attachment:    Type:   Image     Comment:   External Document

## 2010-04-18 NOTE — Medication Information (Signed)
Summary: ccr-lr  Anticoagulant Therapy  Managed by: Vashti Hey, RN PCP: Dr. Lia Hopping Supervising MD: Myrtis Ser MD, Tinnie Gens Indication 1: Atrial Fibrillation Indication 2: Cerebrovascular Accident Lab Used: [facility] [place] Site: [place] INR POC 3.4  Dietary changes: no    Health status changes: no    Bleeding/hemorrhagic complications: no    Recent/future hospitalizations: no    Any changes in medication regimen? no    Recent/future dental: no  Any missed doses?: no       Is patient compliant with meds? yes       Allergies: No Known Drug Allergies  Anticoagulation Management History:      The patient is taking warfarin and comes in today for a routine follow up visit.  He is being anticoagulated due to Atrial Fibrillation .  Positive risk factors for bleeding include an age of 68 years or older, history of CVA/TIA, and presence of serious comorbidities.  Negative risk factors for bleeding include no history of GI bleeding.  The bleeding index is 'high risk'.  Positive CHADS2 values include History of CHF, History of HTN, History of Diabetes, and Prior Stroke/CVA/TIA.  Negative CHADS2 values include Age > 67 years old.  The start date was 05/17/2009.  Anticoagulation responsible provider: Myrtis Ser MD, Tinnie Gens.  INR POC: 3.4.  Cuvette Lot#: [id number].    Anticoagulation Management Assessment/Plan:      The patient's current anticoagulation dose is Warfarin sodium 5 mg tabs: Use as directed by Anticoagulation Clinic.  The target INR is 2.0-3.0.  The next INR is due 12/16/2009.  Anticoagulation instructions were given to patient.  Results were reviewed/authorized by Vashti Hey, RN.  He was notified by Vashti Hey RN.         Prior Anticoagulation Instructions: INR 1.7 Increase coumadin to 5mg  once daily except 2.5mg  on S,T,Th  Current Anticoagulation Instructions: INR 3.4 Decrease coumadin to 2.5mg  once daily except 5mg  on M,W,F

## 2010-04-20 NOTE — Assessment & Plan Note (Signed)
Summary: 6 month fu -vs   Visit Type:  Follow-up Primary Provider:  Dr. Lia Hopping   History of Present Illness: 70 year old male presents for followup. He was seen back in July 2011. He reports no major change in shortness of breath with activity, fairly functionally limited, using a 4 point walker. He has had no falls. Reports no major sense of palpitations.  Heart rate was elevated after ambulating into the office today. This did settle down somewhat after being seated for several minutes. He reports compliance with his medications, although cannot recall any of them from memory. We placed a call to his pharmacy to verify the list below.  Followup labs were requested following the last visit revealing BUN 19, creatinine 1.8, glucose 177, AST 26, ALT 21, triglycerides 676, cholesterol 224, HDL 30, sodium 142, potassium 4.4. Omega-3 supplements were recommended. He reports follow up Dr. Olena Leatherwood later today to discuss his glucose and lipids.  Preventive Screening-Counseling & Management  Alcohol-Tobacco     Cans of tobacco/week: chews 1&1/2 tobacco/day     Tobacco Counseling: not to resume use of tobacco products  Current Medications (verified): 1)  Warfarin Sodium 5 Mg Tabs (Warfarin Sodium) .... Use As Directed By Anticoagulation Clinic 2)  Amaryl 2 Mg Tabs (Glimepiride) .... Take 1 Tablet By Mouth Once A Day 3)  Digoxin 0.125 Mg Tabs (Digoxin) .... Take 1 Tablet By Mouth Once A Day 4)  Diltiazem Hcl Er Beads 180 Mg Xr24h-Cap (Diltiazem Hcl Er Beads) .... Take 1 Tablet By Mouth Once A Day 5)  Metoprolol Succinate 100 Mg Xr24h-Tab (Metoprolol Succinate) .... Take 1 Tablet By Mouth Two Times A Day 6)  Potassium Chloride Crys Cr 20 Meq Cr-Tabs (Potassium Chloride Crys Cr) .... Take 1 Tablet By Mouth Once A Day 7)  Furosemide 40 Mg Tabs (Furosemide) .... Take 2 By Mouth in Am and 1 in Pm 8)  Hydrocodone-Acetaminophen 10-500 Mg Tabs (Hydrocodone-Acetaminophen) .... Take 1 Tablet By Mouth  Three Times A Day As Needed 9)  Fish Oil 1000 Mg Caps (Omega-3 Fatty Acids) .... Take 1 Capsule By Mouth Two Times A Day 10)  Metformin Hcl 500 Mg Tabs (Metformin Hcl) .... Take 1 Tablet By Mouth Two Times A Day 11)  Lopid 600 Mg Tabs (Gemfibrozil) .... Take 1 Tablet By Mouth Two Times A Day  Allergies: No Known Drug Allergies  Comments:  Nurse/Medical Assistant: The patient's medications were reviewed with the patient and were updated in the Medication List. Med list obtained by [facility]. 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  The patient denies anorexia, fever, weight loss, chest pain, syncope, peripheral edema, abdominal pain, melena, and hematochezia.         Otherwise reviewed and negative except as outlined.  Vital Signs:  Patient profile:   70 year old male Height:      73 inches Weight:      250 pounds BMI:     33.10 Pulse rate:   100 / minute BP sitting:   102 /  70  (left arm) Cuff size:   large  Vitals Entered By: Carlye Grippe (March 30, 2010 1:14 PM)  Nutrition Counseling: Patient's BMI is greater than 25 and therefore counseled on weight management options.  Physical Exam  Additional Exam:  Obese male in no acute distress, ambulates with a walker. HEENT: Conjunctivae and lids normal, oropharynx with poor dentition. Neck: Supple, no loud bruits. Lungs: Diminished breath sounds throughout with scattered rhonchi, no active wheezing but prolonged expiratory  phase. Cardiac: Irregularly irregular, distant, no S3. Abdomen: Soft, nontender. Extremities: Chronic appearing edema, trace to one plus, right greater than left. Distal pulses 1+. Skin: Some venous stasis distally. Neuropsychiatric: Alert and oriented x3, hard of hearing, affect appropriate.   EKG  Procedure date:  03/30/2010  Findings:      Atrial fibrillation at 112 beats per minute with leftward axis and nonspecific ST-T changes.  Impression & Recommendations:  Problem # 1:  ATRIAL FIBRILLATION (ICD-427.31)  Rate does not appear to be optimally controlled. He does not feel any definite palpitations or unusual shortness of breath. I am concerned about continuing his digoxin in light of renal insufficiency, and this will be discontinued. Concurrently, diltiazem CD will be advanced to 240 mg daily. Otherwise continue routine follow up over the next 6 months. Interval visits with Dr. Olena Leatherwood.  The following medications were removed from the medication list:    Digoxin 0.125 Mg Tabs (Digoxin) .Marland Kitchen... Take 1 tablet by mouth once a day His updated medication list for this problem includes:    Warfarin Sodium 5 Mg Tabs (Warfarin sodium) ..... Use as directed by anticoagulation clinic    Metoprolol Succinate 100 Mg Xr24h-tab (Metoprolol succinate) .Marland Kitchen... Take 1 tablet by mouth two times a day  Orders: EKG w/ Interpretation (93000) Protime INR (16109)  Problem # 2:  RENAL INSUFFICIENCY, CHRONIC (ICD-585.9)  Creatinine 1.8 by last assessment.  Problem # 3:  MIXED HYPERLIPIDEMIA (ICD-272.2)  His updated medication list for this problem includes:    Lopid 600 Mg Tabs (Gemfibrozil) .Marland Kitchen... Take 1 tablet by mouth two times a day  His updated medication list for this problem includes:    Lopid 600 Mg Tabs (Gemfibrozil) .Marland Kitchen... Take 1 tablet by mouth two times a day  Problem # 4:  CHRONIC SYSTOLIC HEART FAILURE (ICD-428.22)  Mixed cardiomyopathy with mild left ventricular dysfunction,  overall stable symptomatically.  The following medications were removed from the medication list:    Digoxin 0.125 Mg Tabs (Digoxin) .Marland Kitchen... Take 1 tablet by mouth once a day    Benazepril Hcl 20 Mg Tabs (Benazepril hcl) .Marland Kitchen... Take 1 tablet by mouth once a day His updated medication list for this problem includes:    Warfarin Sodium 5 Mg Tabs (Warfarin sodium) ..... Use as directed by anticoagulation clinic    Diltiazem Hcl Er Beads 240 Mg Xr24h-cap (Diltiazem hcl er beads) .Marland Kitchen... Take one capsule by mouth daily    Metoprolol Succinate 100 Mg Xr24h-tab (Metoprolol succinate) .Marland Kitchen... Take 1 tablet by mouth two times a day    Furosemide 40 Mg Tabs (Furosemide) .Marland Kitchen... Take 2 by mouth in am and 1 in pm  The following medications were removed from the medication list:    Digoxin 0.125 Mg Tabs (Digoxin) .Marland Kitchen... Take 1 tablet by mouth once a day    Benazepril Hcl 20 Mg Tabs (Benazepril hcl) .Marland Kitchen... Take 1 tablet by mouth once a day His updated medication list for this problem includes:    Warfarin Sodium 5 Mg Tabs (Warfarin  sodium) ..... Use as directed by anticoagulation clinic    Diltiazem Hcl Er Beads 240 Mg Xr24h-cap (Diltiazem hcl er beads) .Marland Kitchen... Take one capsule by mouth daily    Metoprolol Succinate 100 Mg Xr24h-tab (Metoprolol succinate) .Marland Kitchen... Take 1 tablet by mouth two times a day    Furosemide 40 Mg Tabs (Furosemide) .Marland Kitchen... Take 2 by mouth in am and 1 in pm  Patient Instructions: 1)  Your physician wants you to follow-up in: 6 months. You will receive a reminder letter in the mail one-two months in advance. If you don't receive a letter, please call our office to schedule the follow-up appointment. 2)  Your physician recommends that you go to the [facility] for lab work: DO LABS BEFORE NEXT OFFICE VISIT IN 6 MONTHS. Do not eat or drink after midnight.  3)  STOP DIGOXIN (LANOXIN). 4)  INCREASE DILTIAZEM TO 240MG  DAILY. YOU WILL NEED TO START NEW PRESCRIPTION FOR THIS.  5)  BRING ALL MEDICATIONS TO  EACH OFFICE VISIT. 6)  REVIEW MEDICATION AS LISTED ON THIS PAPER AND NOTIFY OFFICE OF ANY ERRORS.  Prescriptions: DILTIAZEM HCL ER BEADS 240 MG XR24H-CAP (DILTIAZEM HCL ER BEADS) Take one capsule by mouth daily  #30 x 6   Entered by:   Cyril Loosen, RN, BSN   Authorized by:   Loreli Slot, MD, [facility]   Signed by:   Cyril Loosen, RN, BSN on 03/30/2010   Method used:   Electronically to        [facility]  [address] (retail)       [address]       [place], [place]  [address]       Ph: [phone]       Fax: [phone]   RxID:   [id number]   Anticoagulant Therapy  Managed by: Vashti Hey, RN PCP: Dr. Lia Hopping Supervising MD: Diona Browner MD, Remi Deter Indication 1: Atrial Fibrillation Indication 2: Cerebrovascular Accident Lab Used: [facility] [place] Site: [place] INR POC 1.6  Vital Signs: Weight: 250 lbs.  Pulse Rate: 100  Blood Pressure:  102 / 70   Dietary changes: no    Health status changes: no    Bleeding/hemorrhagic complications: no    Recent/future hospitalizations: no    Any changes in medication regimen? no    Recent/future dental: no  Any missed doses?: yes     Details: missed ond dose  Is patient compliant with meds? yes        Appended Document: [facility]  Pt called office today stating he does take Benazepril. This was left off his list from [facility].   Allergies: No Known Drug Allergies

## 2010-04-20 NOTE — Medication Information (Signed)
Summary: Coumadin Clinic  Anticoagulant Therapy  Managed by: Cyril Loosen, RN PCP: Dr. Lia Hopping Supervising MD: Diona Browner MD, Remi Deter Indication 1: Atrial Fibrillation Indication 2: Cerebrovascular Accident Lab Used: [facility] [place] Site: [place] INR POC 1.6  Dietary changes: yes       Details: had more Vit K foods  Health status changes: no    Bleeding/hemorrhagic complications: no    Recent/future hospitalizations: no    Any changes in medication regimen? no    Recent/future dental: no  Any missed doses?: yes     Details: missed 1 dose week ago  Is patient compliant with meds? yes       Allergies: No Known Drug Allergies  Anticoagulation Management History:      The patient is taking warfarin and comes in today for a routine follow up visit.  Anticoagulation is being administered due to Atrial Fibrillation .  Positive risk factors for bleeding include an age of 52 years or older, history of CVA/TIA, and presence of serious comorbidities.  Negative risk factors for bleeding include no history of GI bleeding.  The bleeding index is 'high risk'.  Positive CHADS2 values include History of CHF, History of HTN, History of Diabetes, and Prior Stroke/CVA/TIA.  Negative CHADS2 values include Age > 61 years old.  The start date was 05/17/2009.  Anticoagulation responsible provider: Diona Browner MD, Remi Deter.  INR POC: 1.6.    Anticoagulation Management Assessment/Plan:      The patient's current anticoagulation dose is Warfarin sodium 5 mg tabs: Use as directed by Anticoagulation Clinic.  The target INR is 2.0-3.0.  The next INR is due 04/14/2010.  Anticoagulation instructions were given to patient.  Results were reviewed/authorized by Cyril Loosen, RN.  He was notified by Cyril Loosen, RN.         Prior Anticoagulation Instructions: INR 1.9 Take coumadin 1 1/2 tablets tonight then resume 1 tablet once daily except 1/2 tablet on Sundays, Tuesdays and  Thursdays  Please excuse wife from work.  Husband had appt for blood work and she had to bring him.  Vashti Hey RN  Current Anticoagulation Instructions: INR 1.6 Take coumadin 7.5mg  x 2 then resume 5mg  once daily except 2.5mg  on S,T,Th

## 2010-04-20 NOTE — Letter (Signed)
Summary: Work Writer at [employer]	[place]. [address]   [place], [place] [address]   Phone: [phone]  Fax: [phone]     March 30, 2010    Nathan Zimmerman wife of [name]   The above named patient had a medical visit today at: 1:20   pm.  Please take this into consideration when reviewing the time away from work/school.      Sincerely yours,  Architectural technologist

## 2010-04-20 NOTE — Medication Information (Signed)
Summary: CCR-LAST CK 03/30/10-JM  Anticoagulant Therapy  Managed by: Vashti Hey, RN PCP: Dr. Lia Hopping Supervising MD: Andee Lineman MD, Michelle Piper Indication 1: Atrial Fibrillation Indication 2: Cerebrovascular Accident Lab Used: [facility] [place] Site: [place] INR POC 1.8  Dietary changes: no    Health status changes: no    Bleeding/hemorrhagic complications: no    Recent/future hospitalizations: no    Any changes in medication regimen? no    Recent/future dental: no  Any missed doses?: no       Is patient compliant with meds? yes       Allergies: No Known Drug Allergies  Anticoagulation Management History:      The patient is taking warfarin and comes in today for a routine follow up visit.  Anticoagulation is being administered due to Atrial Fibrillation .  Positive risk factors for bleeding include an age of 68 years or older, history of CVA/TIA, and presence of serious comorbidities.  Negative risk factors for bleeding include no history of GI bleeding.  The bleeding index is 'high risk'.  Positive CHADS2 values include History of CHF, History of HTN, History of Diabetes, and Prior Stroke/CVA/TIA.  Negative CHADS2 values include Age > 68 years old.  The start date was 05/17/2009.  Anticoagulation responsible provider: Andee Lineman MD, Michelle Piper.  INR POC: 1.8.  Cuvette Lot#: [id number].    Anticoagulation Management Assessment/Plan:      The patient's current anticoagulation dose is Warfarin sodium 5 mg tabs: Use as directed by Anticoagulation Clinic.  The target INR is 2.0-3.0.  The next INR is due 05/05/2010.  Anticoagulation instructions were given to patient.  Results were reviewed/authorized by Vashti Hey, RN.  He was notified by Vashti Hey RN.         Prior Anticoagulation Instructions: INR 1.6 Take coumadin 7.5mg  x 2 then resume 5mg  once daily except 2.5mg  on S,T,Th  Current Anticoagulation Instructions: INR 1.8 Take coumadin 7.5mg  tonight then increase dose to 5mg  once  daily except 2.5mg  on Sundays and Thursdays

## 2010-05-05 ENCOUNTER — Encounter: Payer: Self-pay | Admitting: Cardiology

## 2010-05-05 ENCOUNTER — Encounter (INDEPENDENT_AMBULATORY_CARE_PROVIDER_SITE_OTHER): Payer: Medicare HMO

## 2010-05-05 DIAGNOSIS — Z7901 Long term (current) use of anticoagulants: Secondary | ICD-10-CM

## 2010-05-05 DIAGNOSIS — I4891 Unspecified atrial fibrillation: Secondary | ICD-10-CM

## 2010-05-10 NOTE — Medication Information (Signed)
Summary: ccr-lr  Anticoagulant Therapy  Managed by: Vashti Hey, RN PCP: Dr. Lia Hopping Supervising MD: Myrtis Ser MD, Tinnie Gens Indication 1: Atrial Fibrillation Indication 2: Cerebrovascular Accident Lab Used: [facility] [place] Site: [place] INR POC 1.7  Dietary changes: no    Health status changes: no    Bleeding/hemorrhagic complications: no    Recent/future hospitalizations: no    Any changes in medication regimen? no    Recent/future dental: no  Any missed doses?: no       Is patient compliant with meds? yes       Allergies: No Known Drug Allergies  Anticoagulation Management History:      The patient is taking warfarin and comes in today for a routine follow up visit.  Anticoagulation is being administered due to Atrial Fibrillation .  Positive risk factors for bleeding include an age of 3 years or older, history of CVA/TIA, and presence of serious comorbidities.  Negative risk factors for bleeding include no history of GI bleeding.  The bleeding index is 'high risk'.  Positive CHADS2 values include History of CHF, History of HTN, History of Diabetes, and Prior Stroke/CVA/TIA.  Negative CHADS2 values include Age > 75 years old.  The start date was 05/17/2009.  Anticoagulation responsible provider: Myrtis Ser MD, Tinnie Gens.  INR POC: 1.7.  Cuvette Lot#: [id number].    Anticoagulation Management Assessment/Plan:      The patient's current anticoagulation dose is Warfarin sodium 5 mg tabs: Use as directed by Anticoagulation Clinic.  The target INR is 2.0-3.0.  The next INR is due 05/26/2010.  Anticoagulation instructions were given to patient.  Results were reviewed/authorized by Vashti Hey, RN.  He was notified by Vashti Hey RN.         Prior Anticoagulation Instructions: INR 1.8 Take coumadin 7.5mg  tonight then increase dose to 5mg  once daily except 2.5mg  on Sundays and Thursdays   Current Anticoagulation Instructions: INR 1.7 Increase coumadin to 5mg  once daily   PT  WAS SEEN IN OUR OFFICE TODAY.  PLEASE EXCUSE WIFE FROM WORK.

## 2010-05-26 ENCOUNTER — Encounter: Payer: Self-pay | Admitting: Cardiology

## 2010-05-26 ENCOUNTER — Encounter (INDEPENDENT_AMBULATORY_CARE_PROVIDER_SITE_OTHER): Payer: Medicare HMO

## 2010-05-26 DIAGNOSIS — Z7901 Long term (current) use of anticoagulants: Secondary | ICD-10-CM

## 2010-05-26 DIAGNOSIS — I4891 Unspecified atrial fibrillation: Secondary | ICD-10-CM

## 2010-05-30 NOTE — Medication Information (Signed)
Summary: ccr-lr  Anticoagulant Therapy  Managed by: Vashti Hey, RN PCP: Dr. Lia Hopping Supervising MD: Andee Lineman MD, Michelle Piper Indication 1: Atrial Fibrillation Indication 2: Cerebrovascular Accident Lab Used: [facility] [place] Site: [place] INR POC 1.9  Dietary changes: no    Health status changes: no    Bleeding/hemorrhagic complications: no    Recent/future hospitalizations: no    Any changes in medication regimen? no    Recent/future dental: no  Any missed doses?: no       Is patient compliant with meds? yes       Allergies: No Known Drug Allergies  Anticoagulation Management History:      The patient is taking warfarin and comes in today for a routine follow up visit.  Anticoagulation is being administered due to Atrial Fibrillation .  Positive risk factors for bleeding include an age of 70 years or older, history of CVA/TIA, and presence of serious comorbidities.  Negative risk factors for bleeding include no history of GI bleeding.  The bleeding index is 'high risk'.  Positive CHADS2 values include History of CHF, History of HTN, History of Diabetes, and Prior Stroke/CVA/TIA.  Negative CHADS2 values include Age > 67 years old.  The start date was 05/17/2009.  Anticoagulation responsible provider: Andee Lineman MD, Michelle Piper.  INR POC: 1.9.    Anticoagulation Management Assessment/Plan:      The patient's current anticoagulation dose is Warfarin sodium 5 mg tabs: Use as directed by Anticoagulation Clinic.  The target INR is 2.0-3.0.  The next INR is due 06/16/2010.  Anticoagulation instructions were given to patient.  Results were reviewed/authorized by Vashti Hey, RN.  He was notified by Vashti Hey RN.         Prior Anticoagulation Instructions: INR 1.7 Increase coumadin to 5mg  once daily   PT WAS SEEN IN OUR OFFICE TODAY.  PLEASE EXCUSE WIFE FROM WORK.  Current Anticoagulation Instructions: INR 1.9 Increase coumadin 5mg  once daily except 7.5mg  on Fridays

## 2010-06-09 LAB — COMPREHENSIVE METABOLIC PANEL
Albumin: 3.2 g/dL — ABNORMAL LOW (ref 3.5–5.2)
Alkaline Phosphatase: 91 U/L (ref 39–117)
BUN: 18 mg/dL (ref 6–23)
Chloride: 103 mEq/L (ref 96–112)
Glucose, Bld: 228 mg/dL — ABNORMAL HIGH (ref 70–99)
Potassium: 3.9 mEq/L (ref 3.5–5.1)
Total Bilirubin: 0.9 mg/dL (ref 0.3–1.2)

## 2010-06-09 LAB — CBC
HCT: 44.2 % (ref 39.0–52.0)
HCT: 44.4 % (ref 39.0–52.0)
HCT: 49.3 % (ref 39.0–52.0)
Hemoglobin: 13.9 g/dL (ref 13.0–17.0)
Hemoglobin: 14.4 g/dL (ref 13.0–17.0)
Hemoglobin: 14.9 g/dL (ref 13.0–17.0)
Hemoglobin: 15.9 g/dL (ref 13.0–17.0)
Hemoglobin: 16.1 g/dL (ref 13.0–17.0)
MCHC: 32.6 g/dL (ref 30.0–36.0)
MCHC: 32.6 g/dL (ref 30.0–36.0)
MCHC: 33 g/dL (ref 30.0–36.0)
MCHC: 33.5 g/dL (ref 30.0–36.0)
MCV: 88.3 fL (ref 78.0–100.0)
MCV: 89.2 fL (ref 78.0–100.0)
MCV: 89.2 fL (ref 78.0–100.0)
Platelets: 198 10*3/uL (ref 150–400)
RBC: 4.71 MIL/uL (ref 4.22–5.81)
RBC: 5.03 MIL/uL (ref 4.22–5.81)
RBC: 5.38 MIL/uL (ref 4.22–5.81)
RDW: 15.7 % — ABNORMAL HIGH (ref 11.5–15.5)
RDW: 16 % — ABNORMAL HIGH (ref 11.5–15.5)
RDW: 16.1 % — ABNORMAL HIGH (ref 11.5–15.5)
WBC: 6 10*3/uL (ref 4.0–10.5)
WBC: 6.6 10*3/uL (ref 4.0–10.5)

## 2010-06-09 LAB — GLUCOSE, CAPILLARY
Glucose-Capillary: 153 mg/dL — ABNORMAL HIGH (ref 70–99)
Glucose-Capillary: 155 mg/dL — ABNORMAL HIGH (ref 70–99)
Glucose-Capillary: 157 mg/dL — ABNORMAL HIGH (ref 70–99)
Glucose-Capillary: 157 mg/dL — ABNORMAL HIGH (ref 70–99)
Glucose-Capillary: 163 mg/dL — ABNORMAL HIGH (ref 70–99)
Glucose-Capillary: 168 mg/dL — ABNORMAL HIGH (ref 70–99)
Glucose-Capillary: 170 mg/dL — ABNORMAL HIGH (ref 70–99)
Glucose-Capillary: 171 mg/dL — ABNORMAL HIGH (ref 70–99)
Glucose-Capillary: 177 mg/dL — ABNORMAL HIGH (ref 70–99)
Glucose-Capillary: 182 mg/dL — ABNORMAL HIGH (ref 70–99)
Glucose-Capillary: 184 mg/dL — ABNORMAL HIGH (ref 70–99)
Glucose-Capillary: 186 mg/dL — ABNORMAL HIGH (ref 70–99)
Glucose-Capillary: 201 mg/dL — ABNORMAL HIGH (ref 70–99)
Glucose-Capillary: 218 mg/dL — ABNORMAL HIGH (ref 70–99)
Glucose-Capillary: 223 mg/dL — ABNORMAL HIGH (ref 70–99)
Glucose-Capillary: 251 mg/dL — ABNORMAL HIGH (ref 70–99)

## 2010-06-09 LAB — BASIC METABOLIC PANEL
BUN: 18 mg/dL (ref 6–23)
CO2: 30 mEq/L (ref 19–32)
CO2: 30 mEq/L (ref 19–32)
CO2: 31 mEq/L (ref 19–32)
CO2: 32 mEq/L (ref 19–32)
CO2: 34 mEq/L — ABNORMAL HIGH (ref 19–32)
Calcium: 8.3 mg/dL — ABNORMAL LOW (ref 8.4–10.5)
Calcium: 8.6 mg/dL (ref 8.4–10.5)
Calcium: 8.9 mg/dL (ref 8.4–10.5)
Chloride: 100 mEq/L (ref 96–112)
Chloride: 100 mEq/L (ref 96–112)
Chloride: 102 mEq/L (ref 96–112)
Chloride: 96 mEq/L (ref 96–112)
Chloride: 99 mEq/L (ref 96–112)
Creatinine, Ser: 1.41 mg/dL (ref 0.4–1.5)
GFR calc Af Amer: 54 mL/min — ABNORMAL LOW (ref >60)
GFR calc Af Amer: 60 mL/min — ABNORMAL LOW (ref >60)
GFR calc Af Amer: 60 mL/min — ABNORMAL LOW (ref >60)
GFR calc Af Amer: >60 mL/min (ref >60)
GFR calc non Af Amer: 49 mL/min — ABNORMAL LOW (ref >60)
GFR calc non Af Amer: 54 mL/min — ABNORMAL LOW (ref >60)
Glucose, Bld: 147 mg/dL — ABNORMAL HIGH (ref 70–99)
Glucose, Bld: 169 mg/dL — ABNORMAL HIGH (ref 70–99)
Glucose, Bld: 169 mg/dL — ABNORMAL HIGH (ref 70–99)
Potassium: 3.5 mEq/L (ref 3.5–5.1)
Potassium: 3.6 mEq/L (ref 3.5–5.1)
Potassium: 3.8 mEq/L (ref 3.5–5.1)
Potassium: 4 mEq/L (ref 3.5–5.1)
Potassium: 4.1 mEq/L (ref 3.5–5.1)
Sodium: 136 mEq/L (ref 135–145)
Sodium: 137 mEq/L (ref 135–145)
Sodium: 138 mEq/L (ref 135–145)
Sodium: 138 mEq/L (ref 135–145)
Sodium: 141 mEq/L (ref 135–145)

## 2010-06-09 LAB — DIFFERENTIAL
Basophils Absolute: 0 10*3/uL (ref 0.0–0.1)
Basophils Relative: 0 % (ref 0–1)
Monocytes Absolute: 0.5 10*3/uL (ref 0.1–1.0)
Neutro Abs: 5.6 10*3/uL (ref 1.7–7.7)
Neutrophils Relative %: 75 % (ref 43–77)

## 2010-06-09 LAB — HEMOGLOBIN A1C: Mean Plasma Glucose: 272 mg/dL

## 2010-06-09 LAB — POCT CARDIAC MARKERS: Troponin i, poc: <0.05 ng/mL (ref 0.00–0.09)

## 2010-06-09 LAB — PROTIME-INR
INR: 1.82 — ABNORMAL HIGH (ref 0.00–1.49)
INR: 2.07 — ABNORMAL HIGH (ref 0.00–1.49)
INR: 2.3 — ABNORMAL HIGH (ref 0.00–1.49)
Prothrombin Time: 20.9 seconds — ABNORMAL HIGH (ref 11.6–15.2)
Prothrombin Time: 21.8 seconds — ABNORMAL HIGH (ref 11.6–15.2)
Prothrombin Time: 22.4 seconds — ABNORMAL HIGH (ref 11.6–15.2)

## 2010-06-09 LAB — TROPONIN I: Troponin I: 0.04 ng/mL (ref 0.00–0.06)

## 2010-06-10 ENCOUNTER — Encounter: Payer: Self-pay | Admitting: Cardiology

## 2010-06-10 DIAGNOSIS — I4891 Unspecified atrial fibrillation: Secondary | ICD-10-CM

## 2010-06-10 DIAGNOSIS — Z7901 Long term (current) use of anticoagulants: Secondary | ICD-10-CM

## 2010-06-12 LAB — CBC
MCHC: 33.9 g/dL (ref 30.0–36.0)
MCV: 87.8 fL (ref 78.0–100.0)
Platelets: 222 10*3/uL (ref 150–400)
WBC: 6.5 10*3/uL (ref 4.0–10.5)

## 2010-06-12 LAB — GLUCOSE, CAPILLARY

## 2010-06-12 LAB — BASIC METABOLIC PANEL
BUN: 24 mg/dL — ABNORMAL HIGH (ref 6–23)
Chloride: 99 mEq/L (ref 96–112)
Creatinine, Ser: 1.57 mg/dL — ABNORMAL HIGH (ref 0.4–1.5)

## 2010-06-12 LAB — PROTIME-INR: Prothrombin Time: 27 seconds — ABNORMAL HIGH (ref 11.6–15.2)

## 2010-06-16 ENCOUNTER — Ambulatory Visit (INDEPENDENT_AMBULATORY_CARE_PROVIDER_SITE_OTHER): Payer: Medicare HMO | Admitting: *Deleted

## 2010-06-16 DIAGNOSIS — Z7901 Long term (current) use of anticoagulants: Secondary | ICD-10-CM

## 2010-06-16 DIAGNOSIS — I4891 Unspecified atrial fibrillation: Secondary | ICD-10-CM

## 2010-06-16 LAB — POCT INR: INR: 2.5

## 2010-06-17 ENCOUNTER — Other Ambulatory Visit: Payer: Self-pay | Admitting: Cardiovascular Disease

## 2010-06-19 ENCOUNTER — Other Ambulatory Visit: Payer: Self-pay | Admitting: *Deleted

## 2010-06-19 ENCOUNTER — Other Ambulatory Visit: Payer: Self-pay | Admitting: Cardiovascular Disease

## 2010-06-19 MED ORDER — METOPROLOL SUCCINATE ER 100 MG PO TB24: 10000.0000 mg | ORAL_TABLET | Freq: Every day | ORAL | Status: DC

## 2010-06-19 MED ORDER — POTASSIUM CHLORIDE CRYS ER 20 MEQ PO TBCR: 400.0000 meq | EXTENDED_RELEASE_TABLET | Freq: Every day | ORAL | Status: DC

## 2010-06-20 MED ORDER — METOPROLOL SUCCINATE ER 100 MG PO TB24: 100.0000 mg | ORAL_TABLET | Freq: Every day | ORAL | Status: DC

## 2010-06-20 NOTE — Progress Notes (Signed)
Addended by: Cyril Loosen on: 06/20/2010 09:50 AM   Modules accepted: Orders

## 2010-06-24 ENCOUNTER — Other Ambulatory Visit: Payer: Self-pay | Admitting: *Deleted

## 2010-06-24 MED ORDER — METOPROLOL SUCCINATE ER 100 MG PO TB24: 100.0000 mg | ORAL_TABLET | Freq: Two times a day (BID) | ORAL | Status: DC

## 2010-06-26 ENCOUNTER — Other Ambulatory Visit: Payer: Self-pay | Admitting: *Deleted

## 2010-06-26 MED ORDER — METOPROLOL SUCCINATE ER 100 MG PO TB24: 100.0000 mg | ORAL_TABLET | Freq: Two times a day (BID) | ORAL | Status: DC

## 2010-06-30 ENCOUNTER — Other Ambulatory Visit: Payer: Self-pay | Admitting: *Deleted

## 2010-06-30 MED ORDER — METOPROLOL SUCCINATE ER 100 MG PO TB24: 100.0000 mg | ORAL_TABLET | Freq: Two times a day (BID) | ORAL | Status: DC

## 2010-07-14 ENCOUNTER — Ambulatory Visit (INDEPENDENT_AMBULATORY_CARE_PROVIDER_SITE_OTHER): Payer: Medicare Other | Admitting: *Deleted

## 2010-07-14 DIAGNOSIS — I4891 Unspecified atrial fibrillation: Secondary | ICD-10-CM

## 2010-07-14 DIAGNOSIS — Z7901 Long term (current) use of anticoagulants: Secondary | ICD-10-CM

## 2010-07-14 LAB — POCT INR: INR: 2.4

## 2010-07-31 ENCOUNTER — Telehealth: Payer: Self-pay | Admitting: *Deleted

## 2010-07-31 ENCOUNTER — Other Ambulatory Visit (INDEPENDENT_AMBULATORY_CARE_PROVIDER_SITE_OTHER): Payer: Medicare Other | Admitting: *Deleted

## 2010-07-31 DIAGNOSIS — N189 Chronic kidney disease, unspecified: Secondary | ICD-10-CM

## 2010-07-31 DIAGNOSIS — Z79899 Other long term (current) drug therapy: Secondary | ICD-10-CM

## 2010-07-31 DIAGNOSIS — I4891 Unspecified atrial fibrillation: Secondary | ICD-10-CM

## 2010-07-31 DIAGNOSIS — Z7901 Long term (current) use of anticoagulants: Secondary | ICD-10-CM

## 2010-07-31 DIAGNOSIS — E782 Mixed hyperlipidemia: Secondary | ICD-10-CM

## 2010-07-31 NOTE — Telephone Encounter (Signed)
Pt called stating he needs to have labs before his July appt. He was notified to go to the [facility] to have lab work done. He is aware to be fasting for last. Appt scheduled for July 2nd.

## 2010-08-08 IMAGING — CR DG CHEST 1V PORT
1 series · 1 of 1 positions shown · non-contrast
Comparison: None.

CLINICAL DATA: Increased tachycardia with bilateral leg swelling.

PORTABLE CHEST - 1 VIEW

[view not recorded]
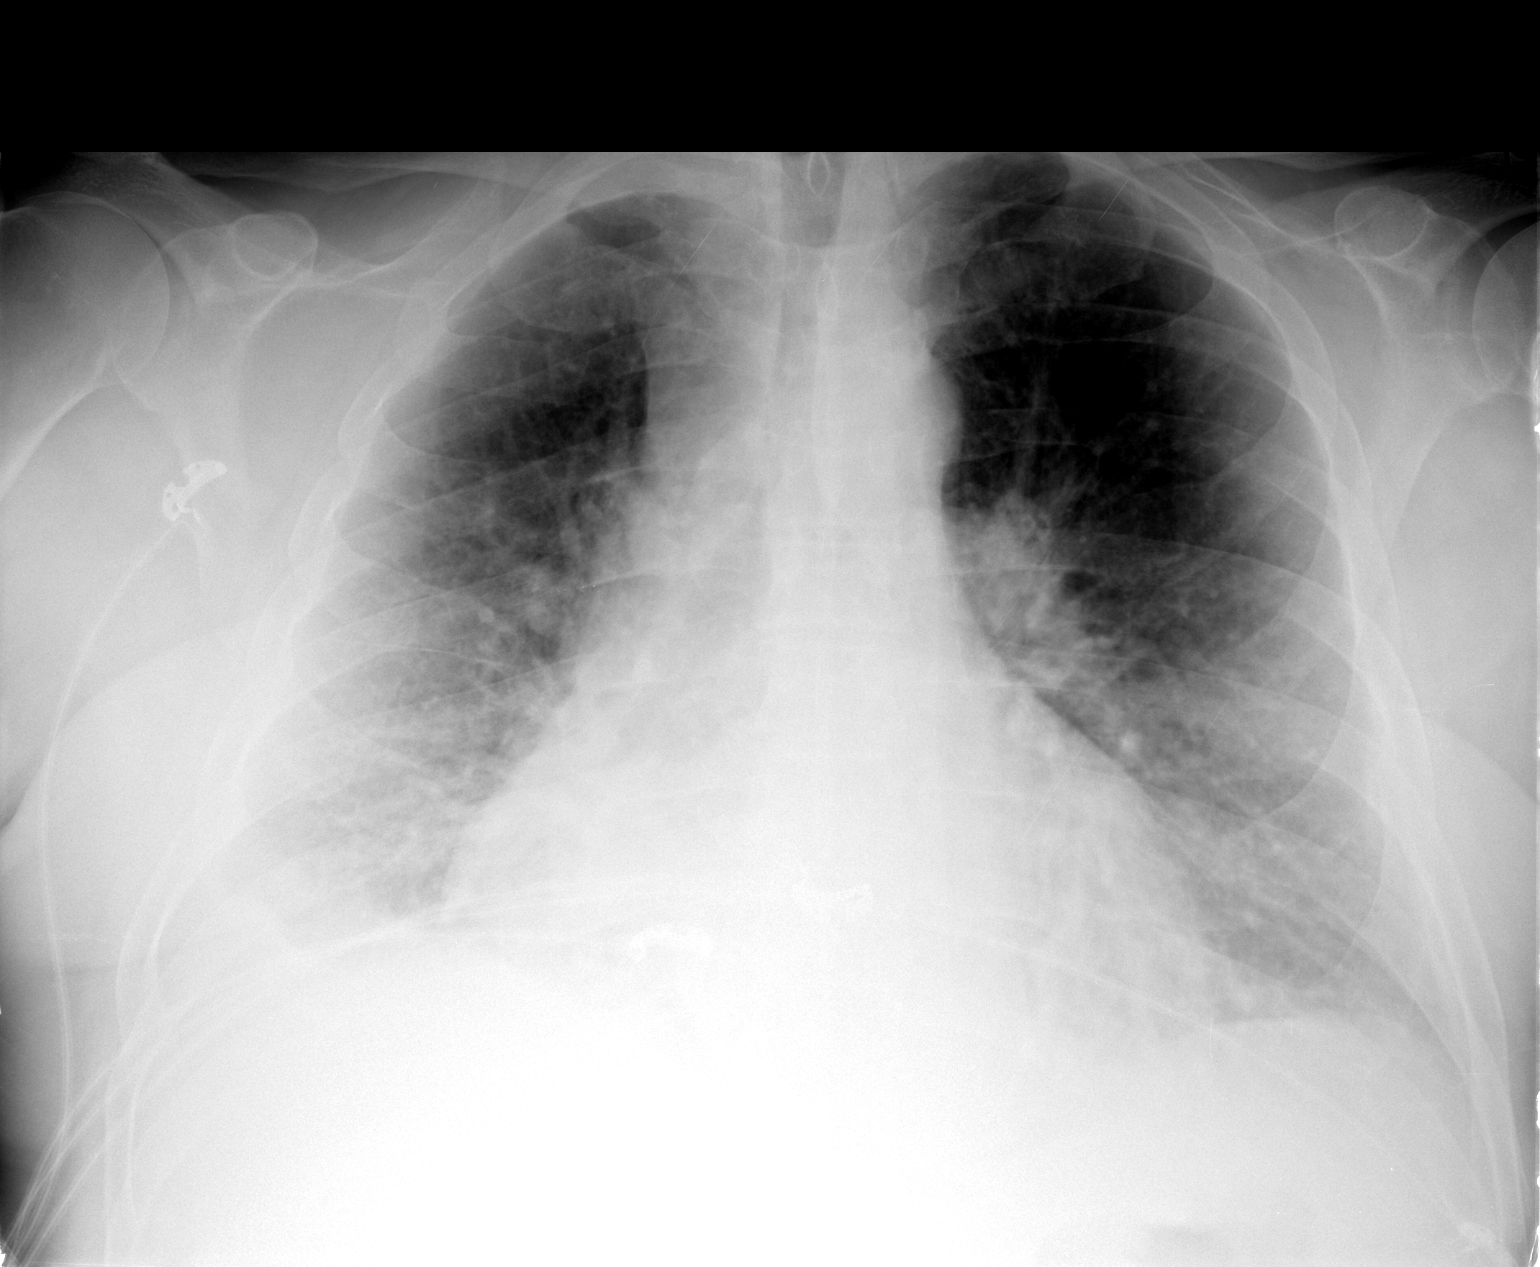

[1 of 1 positions shown; findings below may reference images not displayed]

FINDINGS: 9897 hours.  There is mild rotation to the right.  The
heart is mildly enlarged.  There is vascular congestion with
probable mild pulmonary edema.  There is a small right pleural
effusion with associated right basilar pulmonary opacity.  No focal
airspace disease is identified on the left.  There is no
pneumothorax.
IMPRESSION: Probable mild pulmonary edema with associated right pleural
effusion.

## 2010-08-11 ENCOUNTER — Ambulatory Visit (INDEPENDENT_AMBULATORY_CARE_PROVIDER_SITE_OTHER): Payer: Medicare Other | Admitting: *Deleted

## 2010-08-11 DIAGNOSIS — I4891 Unspecified atrial fibrillation: Secondary | ICD-10-CM

## 2010-08-11 LAB — POCT INR: INR: 2.2

## 2010-08-22 ENCOUNTER — Telehealth: Payer: Self-pay | Admitting: *Deleted

## 2010-08-22 NOTE — Telephone Encounter (Signed)
Pt states he has been taking Potassium 20 mEq daily per Dr. Diona Browner. He states he went to Dr. Olena Leatherwood yesterday and was given a prescription for 10 mEq 2 tablets twice per day. He wants to know if this is okay to take. Pt notified he will need to d/w Dr. Olena Leatherwood as he made this change. Pt verbalized understanding.

## 2010-08-25 ENCOUNTER — Telehealth: Payer: Self-pay | Admitting: *Deleted

## 2010-08-25 NOTE — Telephone Encounter (Signed)
Debarah Crape w/[employer] called stating pt has appt w/Dr. Diona Browner on 7/2, who she is aware is a heart doctor. She would like to know if Dr. Diona Browner would evaluate and dictate need for O2, including room air sat, to meet medical necessity for oxygen. Notified Debarah Crape that we would prefer pt obtain this from primary MD or whoever treats him for his COPD. Claudia verbalized understanding.

## 2010-08-29 ENCOUNTER — Encounter: Payer: Self-pay | Admitting: Cardiology

## 2010-09-08 ENCOUNTER — Ambulatory Visit (INDEPENDENT_AMBULATORY_CARE_PROVIDER_SITE_OTHER): Payer: Medicare Other | Admitting: *Deleted

## 2010-09-08 DIAGNOSIS — I4891 Unspecified atrial fibrillation: Secondary | ICD-10-CM

## 2010-09-08 DIAGNOSIS — Z7901 Long term (current) use of anticoagulants: Secondary | ICD-10-CM

## 2010-09-08 LAB — POCT INR: INR: 2

## 2010-09-18 ENCOUNTER — Ambulatory Visit (INDEPENDENT_AMBULATORY_CARE_PROVIDER_SITE_OTHER): Payer: Medicare Other | Admitting: Cardiology

## 2010-09-18 ENCOUNTER — Encounter: Payer: Self-pay | Admitting: Cardiology

## 2010-09-18 VITALS — BP 119/76 | HR 87 | Ht 73.0 in | Wt 264.0 lb

## 2010-09-18 DIAGNOSIS — N189 Chronic kidney disease, unspecified: Secondary | ICD-10-CM

## 2010-09-18 DIAGNOSIS — I5022 Chronic systolic (congestive) heart failure: Secondary | ICD-10-CM

## 2010-09-18 DIAGNOSIS — I4891 Unspecified atrial fibrillation: Secondary | ICD-10-CM

## 2010-09-18 NOTE — Assessment & Plan Note (Signed)
Continue followup with Dr. Olena Leatherwood, last creatinine 1.8.

## 2010-09-18 NOTE — Assessment & Plan Note (Signed)
LVEF approximately 45%. Reports NYHA class II dyspnea on exertion. Continue medical therapy.

## 2010-09-18 NOTE — Assessment & Plan Note (Signed)
Heart rate seems reasonably well-controlled on present regimen. Continue Coumadin. No other changes made.

## 2010-09-18 NOTE — Progress Notes (Signed)
Clinical Summary [name] is a 70 y.o.male presenting for followup.   He reports no significant palpitations or progressive shortness of breath, no chest pain. He continues to use a walker for ambulation.  Denies any bleeding problems on Coumadin. We adjusted his medications at the last visit including discontinuation of digoxin, and increase in diltiazem. Heart rate seems better controlled today.   No Known Allergies  Current outpatient prescriptions:benazepril (LOTENSIN) 20 MG tablet, Take 20 tablets by mouth Daily., Disp: , Rfl: ;  diltiazem (CARDIZEM CD) 240 MG 24 hr capsule, Take 240 capsules by mouth Daily., Disp: , Rfl: ;  furosemide (LASIX) 40 MG tablet, Take 80 mg by mouth every morning. And one in evening, Disp: , Rfl: ;  gemfibrozil (LOPID) 600 MG tablet, Take 600 tablets by mouth Twice daily., Disp: , Rfl:  glimepiride (AMARYL) 2 MG tablet, Take 2 tablets by mouth Daily., Disp: , Rfl: ;  insulin NPH-insulin regular (HUMULIN 70/30) (70-30) 100 UNIT/ML injection, Inject 20 Units into the skin 2 (two) times daily.  , Disp: , Rfl: ;  metFORMIN (GLUCOPHAGE) 500 MG tablet, Take 500 mg by mouth Twice daily. , Disp: , Rfl: ;  metoprolol (TOPROL XL) 100 MG 24 hr tablet, Take 1 tablet (100 mg total) by mouth 2 (two) times daily., Disp: 60 tablet, Rfl: 6 Omega-3 Fatty Acids (FISH OIL) 1000 MG CAPS, Take 1 capsule by mouth 3 (three) times daily. , Disp: , Rfl: ;  potassium chloride SA (K-DUR,KLOR-CON) 20 MEQ tablet, Take 20 mEq by mouth daily.  , Disp: , Rfl: ;  warfarin (COUMADIN) 5 MG tablet, Take by mouth as directed.  , Disp: , Rfl: ;  DISCONTD: potassium chloride SA (KLOR-CON M20) 20 MEQ tablet, Take 20 tablets (400 mEq total) by mouth daily., Disp: 30 tablet, Rfl: 6 DISCONTD: HYDROcodone-acetaminophen (LORTAB) 10-500 MG per tablet, Take 10-500 tablets by mouth Three times a day., Disp: , Rfl:   Past Medical History  Diagnosis Date  . Atrial fibrillation   . COPD (chronic obstructive  pulmonary disease)   . Diabetes mellitus, type 2   . Stroke   . 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 Arthritis   . Renal insufficiency     Social History Mr. Llorente reports that he quit smoking about 2 years ago. His smoking use included Cigarettes. He has a 120 pack-year smoking history. His smokeless tobacco use includes Chew. Mr. Angelino reports that he does not drink alcohol.  Review of Systems Otherwise reviewed and negative.  Physical Examination Filed Vitals:   09/18/10 0824  BP: 119/76  Pulse: 87   Obese male in no acute distress, ambulates with a walker.  HEENT: Conjunctivae and lids normal, oropharynx with poor dentition.  Neck: Supple, no loud bruits.  Lungs: Diminished breath sounds throughout with scattered rhonchi, no active wheezing but prolonged expiratory phase.  Cardiac: Irregularly irregular, distant, no S3.  Abdomen: Soft, nontender.  Extremities: Chronic appearing edema, trace to one plus, right greater than left. Distal pulses 1+.  Skin: Some venous stasis distally.  Neuropsychiatric: Alert and oriented x3, hard of hearing, affect appropriate.   ECG Atrial fibrillation at 93 beats per minute, leftward axis, nonspecific T wave changes.   Problem List and Plan

## 2010-09-18 NOTE — Patient Instructions (Signed)
Continue all current medications. Your physician wants you to follow up in: 6 months.  You will receive a reminder letter in the mail one-two months in advance.  If you don't receive a letter, please call our office to schedule the follow up appointment   

## 2010-10-06 ENCOUNTER — Ambulatory Visit (INDEPENDENT_AMBULATORY_CARE_PROVIDER_SITE_OTHER): Payer: Medicare Other | Admitting: *Deleted

## 2010-10-06 DIAGNOSIS — I4891 Unspecified atrial fibrillation: Secondary | ICD-10-CM

## 2010-10-06 DIAGNOSIS — Z7901 Long term (current) use of anticoagulants: Secondary | ICD-10-CM

## 2010-10-06 LAB — POCT INR: INR: 2.7

## 2010-11-03 ENCOUNTER — Ambulatory Visit (INDEPENDENT_AMBULATORY_CARE_PROVIDER_SITE_OTHER): Payer: Medicare Other | Admitting: *Deleted

## 2010-11-03 DIAGNOSIS — I4891 Unspecified atrial fibrillation: Secondary | ICD-10-CM

## 2010-11-03 DIAGNOSIS — Z7901 Long term (current) use of anticoagulants: Secondary | ICD-10-CM

## 2010-11-27 ENCOUNTER — Other Ambulatory Visit: Payer: Self-pay | Admitting: Cardiology

## 2010-12-01 ENCOUNTER — Telehealth: Payer: Self-pay | Admitting: *Deleted

## 2010-12-01 ENCOUNTER — Ambulatory Visit (INDEPENDENT_AMBULATORY_CARE_PROVIDER_SITE_OTHER): Payer: Medicare Other | Admitting: *Deleted

## 2010-12-01 DIAGNOSIS — I4891 Unspecified atrial fibrillation: Secondary | ICD-10-CM

## 2010-12-01 DIAGNOSIS — Z7901 Long term (current) use of anticoagulants: Secondary | ICD-10-CM

## 2010-12-01 LAB — POCT INR: INR: 3.2

## 2010-12-01 NOTE — Telephone Encounter (Signed)
Please excuse patient's wife from leaving work early today.  Mr Schnabel had an MD appointment and she had to bring him.  Vashti Hey RN

## 2010-12-29 ENCOUNTER — Encounter: Payer: Self-pay | Admitting: *Deleted

## 2010-12-29 ENCOUNTER — Ambulatory Visit (INDEPENDENT_AMBULATORY_CARE_PROVIDER_SITE_OTHER): Payer: Medicare Other | Admitting: *Deleted

## 2010-12-29 DIAGNOSIS — I4891 Unspecified atrial fibrillation: Secondary | ICD-10-CM

## 2010-12-29 DIAGNOSIS — Z7901 Long term (current) use of anticoagulants: Secondary | ICD-10-CM

## 2010-12-29 LAB — POCT INR: INR: 4

## 2011-01-12 ENCOUNTER — Ambulatory Visit (INDEPENDENT_AMBULATORY_CARE_PROVIDER_SITE_OTHER): Payer: Medicare Other | Admitting: *Deleted

## 2011-01-12 DIAGNOSIS — I4891 Unspecified atrial fibrillation: Secondary | ICD-10-CM

## 2011-01-12 DIAGNOSIS — Z7901 Long term (current) use of anticoagulants: Secondary | ICD-10-CM

## 2011-01-12 LAB — POCT INR: INR: 2.3

## 2011-01-29 ENCOUNTER — Other Ambulatory Visit: Payer: Self-pay | Admitting: *Deleted

## 2011-01-29 MED ORDER — METOPROLOL SUCCINATE ER 100 MG PO TB24: 100.0000 mg | ORAL_TABLET | Freq: Two times a day (BID) | ORAL | Status: DC

## 2011-02-02 ENCOUNTER — Ambulatory Visit (INDEPENDENT_AMBULATORY_CARE_PROVIDER_SITE_OTHER): Payer: Medicare Other | Admitting: *Deleted

## 2011-02-02 ENCOUNTER — Encounter: Payer: Self-pay | Admitting: *Deleted

## 2011-02-02 DIAGNOSIS — Z7901 Long term (current) use of anticoagulants: Secondary | ICD-10-CM

## 2011-02-02 DIAGNOSIS — I4891 Unspecified atrial fibrillation: Secondary | ICD-10-CM

## 2011-02-02 LAB — POCT INR: INR: 1.9

## 2011-02-05 ENCOUNTER — Other Ambulatory Visit: Payer: Self-pay | Admitting: Cardiology

## 2011-03-02 ENCOUNTER — Ambulatory Visit (INDEPENDENT_AMBULATORY_CARE_PROVIDER_SITE_OTHER): Payer: Medicare Other | Admitting: *Deleted

## 2011-03-02 DIAGNOSIS — I4891 Unspecified atrial fibrillation: Secondary | ICD-10-CM

## 2011-03-02 DIAGNOSIS — Z7901 Long term (current) use of anticoagulants: Secondary | ICD-10-CM

## 2011-03-02 LAB — POCT INR: INR: 2.2

## 2011-03-21 ENCOUNTER — Ambulatory Visit: Payer: Medicare Other | Admitting: Cardiology

## 2011-03-22 ENCOUNTER — Ambulatory Visit: Payer: Medicare Other | Admitting: Cardiology

## 2011-03-29 ENCOUNTER — Encounter: Payer: Self-pay | Admitting: *Deleted

## 2011-03-29 ENCOUNTER — Encounter: Payer: Self-pay | Admitting: Cardiology

## 2011-03-29 ENCOUNTER — Ambulatory Visit (INDEPENDENT_AMBULATORY_CARE_PROVIDER_SITE_OTHER): Payer: Medicare Other | Admitting: Cardiology

## 2011-03-29 VITALS — BP 129/95 | HR 98 | Ht 73.0 in | Wt 283.0 lb

## 2011-03-29 DIAGNOSIS — I4891 Unspecified atrial fibrillation: Secondary | ICD-10-CM

## 2011-03-29 DIAGNOSIS — I429 Cardiomyopathy, unspecified: Secondary | ICD-10-CM

## 2011-03-29 DIAGNOSIS — M6281 Muscle weakness (generalized): Secondary | ICD-10-CM

## 2011-03-29 DIAGNOSIS — E782 Mixed hyperlipidemia: Secondary | ICD-10-CM

## 2011-03-29 DIAGNOSIS — R29898 Other symptoms and signs involving the musculoskeletal system: Secondary | ICD-10-CM | POA: Insufficient documentation

## 2011-03-29 MED ORDER — METOPROLOL SUCCINATE ER 200 MG PO TB24: 200.0000 mg | ORAL_TABLET | Freq: Every day | ORAL | Status: DC

## 2011-03-29 MED ORDER — DILTIAZEM HCL ER COATED BEADS 240 MG PO CP24: 240.0000 mg | ORAL_CAPSULE | ORAL | Status: DC

## 2011-03-29 NOTE — Progress Notes (Signed)
Clinical Summary Nathan Zimmerman is a 71 y.o.male presenting for followup. He was seen in July 2012.  He reports only rare, brief episodes of chest pain, no sense of palpitations or progressive shortness of breath. We reviewed his medications today. He reports no bleeding problems on Coumadin. No orthopnea or PND.  Main complaint is of progressive leg weakness. He still has residual left leg weakness following prior stroke, states it seems to be getting worse, also involving the right leg. He is using a walker. No recent falls.   No Known Allergies  Current Outpatient Prescriptions  Medication Sig Dispense Refill  . benazepril (LOTENSIN) 20 MG tablet Take 20 tablets by mouth Daily.      Marland Kitchen diltiazem (CARDIZEM CD) 240 MG 24 hr capsule Take 1 capsule (240 mg total) by mouth every morning.      . furosemide (LASIX) 40 MG tablet Take 40 mg by mouth 3 (three) times daily. And one in evening      . gabapentin (NEURONTIN) 300 MG capsule Take 300 mg by mouth 3 (three) times daily.        Marland Kitchen gemfibrozil (LOPID) 600 MG tablet Take 600 tablets by mouth Twice daily.      Marland Kitchen glimepiride (AMARYL) 2 MG tablet Take 2 tablets by mouth Daily.      . insulin NPH-insulin regular (HUMULIN 70/30) (70-30) 100 UNIT/ML injection Inject 20 Units into the skin 2 (two) times daily.        . metFORMIN (GLUCOPHAGE) 500 MG tablet Take 500 mg by mouth Twice daily.       . Omega-3 Fatty Acids (FISH OIL) 1000 MG CAPS Take 1 capsule by mouth 3 (three) times daily.       . potassium chloride SA (K-DUR,KLOR-CON) 20 MEQ tablet TAKE 1 TABLET EVERY DAY  30 tablet  6  . warfarin (COUMADIN) 5 MG tablet Take by mouth as directed.        . metoprolol (TOPROL-XL) 200 MG 24 hr tablet Take 1 tablet (200 mg total) by mouth daily.  30 tablet  6    Past Medical History  Diagnosis Date  . Atrial fibrillation   . COPD (chronic obstructive pulmonary disease)   . Diabetes mellitus, type 2   . Stroke   . Cardiomyopathy     Mixed, LVEF 45%,  diastolic dysfunction  . Arthritis   . Renal insufficiency     Social History Mr. Theurer reports that he quit smoking about 2 years ago. His smoking use included Cigarettes. He has a 120 pack-year smoking history. His smokeless tobacco use includes Chew. Mr. Gravely reports that he does not drink alcohol.  Review of Systems As outlined above. No change in appetite. No unusual weight loss. No pitting edema. Otherwise negative.  Physical Examination Filed Vitals:   03/29/11 0838  BP: 129/95  Pulse: 98   Obese male in no acute distress, ambulates with a walker.  HEENT: Conjunctivae and lids normal, oropharynx with poor dentition.  Neck: Supple, no loud bruits.  Lungs: Diminished breath sounds throughout with scattered rhonchi, no active wheezing but prolonged expiratory phase.  Cardiac: Irregularly irregular, distant, no S3.  Abdomen: Soft, nontender.  Extremities: Chronic appearing edema, trace to one plus, right greater than left. Distal pulses 1+.  Skin: Some venous stasis distally.  Neuropsychiatric: Alert and oriented x3, affect appropriate. Leg weakness left greater than right, 2-3/5. Arm strength symmetrical. No speech deficit. Remains hard of hearing.    Problem List and Plan

## 2011-03-29 NOTE — Assessment & Plan Note (Signed)
Continue followup with Dr. Olena Leatherwood. Aim for LDL under 100.

## 2011-03-29 NOTE — Assessment & Plan Note (Signed)
Patient has previous history of stroke, some weakness is residual, however he seems to have progressive symptoms. Continue Coumadin in light of atrial fibrillation. Referral made to Dr. Gerilyn Pilgrim for neurological assessment.

## 2011-03-29 NOTE — Assessment & Plan Note (Signed)
Followed by Dr. Hasanaj. 

## 2011-03-29 NOTE — Patient Instructions (Signed)
   Take Cardizem in the morning  Stop Toprol XL 100mg  twice a day    Change to Toprol XL 200mg  daily  Referral to Dr. Gerilyn Pilgrim Your physician wants you to follow up in: 6 months.  You will receive a reminder letter in the mail one-two months in advance.  If you don't receive a letter, please call our office to schedule the follow up appointment

## 2011-03-29 NOTE — Assessment & Plan Note (Signed)
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

## 2011-03-29 NOTE — Assessment & Plan Note (Signed)
Chronic. Continue strategy of heart rate control and anticoagulation with followup in the Coumadin clinic. Toprol-XL changed to 200 mg once in the evening, continue Cardizem CD in the morning as is.

## 2011-04-06 ENCOUNTER — Encounter: Payer: Medicare Other | Admitting: *Deleted

## 2011-04-13 ENCOUNTER — Encounter: Payer: Medicare Other | Admitting: *Deleted

## 2011-04-20 ENCOUNTER — Ambulatory Visit (INDEPENDENT_AMBULATORY_CARE_PROVIDER_SITE_OTHER): Payer: Medicare Other | Admitting: *Deleted

## 2011-04-20 DIAGNOSIS — I4891 Unspecified atrial fibrillation: Secondary | ICD-10-CM

## 2011-04-20 DIAGNOSIS — Z7901 Long term (current) use of anticoagulants: Secondary | ICD-10-CM

## 2011-04-20 LAB — POCT INR: INR: 2.3

## 2011-05-18 ENCOUNTER — Ambulatory Visit (INDEPENDENT_AMBULATORY_CARE_PROVIDER_SITE_OTHER): Payer: Medicare Other | Admitting: *Deleted

## 2011-05-18 ENCOUNTER — Encounter: Payer: Self-pay | Admitting: *Deleted

## 2011-05-18 DIAGNOSIS — I4891 Unspecified atrial fibrillation: Secondary | ICD-10-CM

## 2011-05-18 DIAGNOSIS — Z7901 Long term (current) use of anticoagulants: Secondary | ICD-10-CM

## 2011-05-18 LAB — POCT INR: INR: 2.3

## 2011-05-18 NOTE — Progress Notes (Signed)
This encounter was created in error - please disregard.

## 2011-06-18 ENCOUNTER — Other Ambulatory Visit: Payer: Self-pay | Admitting: Cardiology

## 2011-06-22 ENCOUNTER — Ambulatory Visit (INDEPENDENT_AMBULATORY_CARE_PROVIDER_SITE_OTHER): Payer: Medicare Other | Admitting: *Deleted

## 2011-06-22 DIAGNOSIS — I4891 Unspecified atrial fibrillation: Secondary | ICD-10-CM

## 2011-06-22 DIAGNOSIS — Z7901 Long term (current) use of anticoagulants: Secondary | ICD-10-CM

## 2011-08-10 ENCOUNTER — Ambulatory Visit (INDEPENDENT_AMBULATORY_CARE_PROVIDER_SITE_OTHER): Payer: Medicare Other | Admitting: *Deleted

## 2011-08-10 DIAGNOSIS — Z7901 Long term (current) use of anticoagulants: Secondary | ICD-10-CM

## 2011-08-10 DIAGNOSIS — I4891 Unspecified atrial fibrillation: Secondary | ICD-10-CM

## 2011-09-03 ENCOUNTER — Other Ambulatory Visit: Payer: Self-pay | Admitting: Cardiology

## 2011-09-21 ENCOUNTER — Ambulatory Visit (INDEPENDENT_AMBULATORY_CARE_PROVIDER_SITE_OTHER): Payer: Medicare Other | Admitting: *Deleted

## 2011-09-21 DIAGNOSIS — Z7901 Long term (current) use of anticoagulants: Secondary | ICD-10-CM

## 2011-09-21 DIAGNOSIS — I4891 Unspecified atrial fibrillation: Secondary | ICD-10-CM

## 2011-10-03 ENCOUNTER — Ambulatory Visit (INDEPENDENT_AMBULATORY_CARE_PROVIDER_SITE_OTHER): Payer: Medicare Other | Admitting: Cardiology

## 2011-10-03 ENCOUNTER — Encounter: Payer: Self-pay | Admitting: Cardiology

## 2011-10-03 VITALS — BP 109/73 | HR 92 | Ht 73.0 in | Wt 289.0 lb

## 2011-10-03 DIAGNOSIS — I5022 Chronic systolic (congestive) heart failure: Secondary | ICD-10-CM

## 2011-10-03 DIAGNOSIS — I4891 Unspecified atrial fibrillation: Secondary | ICD-10-CM

## 2011-10-03 DIAGNOSIS — N189 Chronic kidney disease, unspecified: Secondary | ICD-10-CM

## 2011-10-03 NOTE — Assessment & Plan Note (Signed)
Keep followup with Dr. Hasanaj. 

## 2011-10-03 NOTE — Assessment & Plan Note (Signed)
Weight is up 6 pounds, although he reports no change in chronic dyspnea on exertion, no progressive orthopnea or PND. Actually has improved leg edema. No changes to current regimen.

## 2011-10-03 NOTE — Assessment & Plan Note (Signed)
Continue with strategy of heart rate control and anticoagulation. Followup ECG reviewed.

## 2011-10-03 NOTE — Patient Instructions (Addendum)
Your physician recommends that you schedule a follow-up appointment in: 6 months with Dr. McDowell. You will receive a reminder letter in the mail in about 4 months reminding you to call and schedule your appointment. If you don't receive this letter, please contact our office.  Your physician recommends that you continue on your current medications as directed. Please refer to the Current Medication list given to you today.  

## 2011-10-03 NOTE — Progress Notes (Signed)
Clinical Summary Nathan Zimmerman is a 71 y.o.male presenting for followup. He was seen in January. He continues to ambulate with a walker related to chronic leg weakness following previous stroke. He did not follow up with neurology, although we did discuss this at his last visit.  He denies any falls or major bleeding problems. Reports no specific sense of palpitations or chest pain.  INR was therapeutic on most recent check.  Weight is up 6 pounds from last visit. He reports no change in chronic dyspnea on exertion.   No Known Allergies  Current Outpatient Prescriptions  Medication Sig Dispense Refill  . benazepril (LOTENSIN) 20 MG tablet Take 20 tablets by mouth Daily.      Marland Kitchen diltiazem (CARDIZEM CD) 240 MG 24 hr capsule TAKE (1) CAPSULE ONCE DAILY  30 capsule  6  . furosemide (LASIX) 40 MG tablet Take 40 mg by mouth 3 (three) times daily. And one in evening      . gabapentin (NEURONTIN) 300 MG capsule Take 300 mg by mouth 3 (three) times daily.        Marland Kitchen gemfibrozil (LOPID) 600 MG tablet Take 600 tablets by mouth Twice daily.      Marland Kitchen glimepiride (AMARYL) 2 MG tablet Take 2 tablets by mouth Daily.      . Insulin Isophane & Regular (NOVOLIN 70/30 [place]) Inject 25 Units into the skin 2 (two) times daily.      . metFORMIN (GLUCOPHAGE) 500 MG tablet Take 500 mg by mouth Twice daily.       . metoprolol (TOPROL-XL) 200 MG 24 hr tablet Take 1 tablet (200 mg total) by mouth daily.  30 tablet  6  . Omega-3 Fatty Acids (FISH OIL) 1000 MG CAPS Take 1 capsule by mouth 3 (three) times daily.       . potassium chloride SA (K-DUR,KLOR-CON) 20 MEQ tablet TAKE 1 TABLET EVERY DAY  30 tablet  6  . warfarin (COUMADIN) 5 MG tablet Take by mouth as directed. Managed by Dr. Olena Leatherwood        Past Medical History  Diagnosis Date  . Atrial fibrillation   . COPD (chronic obstructive pulmonary disease)   . Diabetes mellitus, type 2   . Stroke   . 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  Arthritis   . Renal insufficiency     Social History Nathan Zimmerman reports that he quit smoking about 3 years ago. His smoking use included Cigarettes. He has a 120 pack-year smoking history. His smokeless tobacco use includes Chew. Nathan Zimmerman reports that he does not drink alcohol.  Review of Systems Hard of hearing. Reports stable appetite, intermittent lower extremity swelling. Otherwise negative.  Physical Examination Filed Vitals:   10/03/11 1255  BP: 109/73  Pulse: 92    Obese male in no acute distress, ambulates with a walker.  HEENT: Conjunctivae and lids normal, oropharynx with poor dentition.  Neck: Supple, no loud bruits.  Lungs: Diminished breath sounds throughout with scattered rhonchi, no active wheezing but prolonged expiratory phase.  Cardiac: Irregularly irregular, distant, no S3.  Abdomen: Soft, nontender.  Extremities: Chronic appearing edema, trace. Distal pulses 1+.  Skin: Some venous stasis distally.  Neuropsychiatric: Alert and oriented x3, affect appropriate.   ECG Atrial fibrillation with left axis deviation, nonspecific ST-T changes.  Problem List and Plan   ATRIAL FIBRILLATION Continue with strategy of heart rate control and anticoagulation. Followup ECG reviewed.  CHRONIC SYSTOLIC HEART FAILURE Weight is  up 6 pounds, although he reports no change in chronic dyspnea on exertion, no progressive orthopnea or PND. Actually has improved leg edema. No changes to current regimen.  RENAL INSUFFICIENCY, CHRONIC Keep followup with Dr. Olena Leatherwood.    Jonelle Sidle, M.D., F.A.C.C.

## 2011-10-22 ENCOUNTER — Other Ambulatory Visit: Payer: Self-pay | Admitting: Cardiology

## 2011-11-09 ENCOUNTER — Ambulatory Visit (INDEPENDENT_AMBULATORY_CARE_PROVIDER_SITE_OTHER): Payer: Medicare Other | Admitting: *Deleted

## 2011-11-09 DIAGNOSIS — Z7901 Long term (current) use of anticoagulants: Secondary | ICD-10-CM

## 2011-11-09 DIAGNOSIS — I4891 Unspecified atrial fibrillation: Secondary | ICD-10-CM

## 2011-12-11 ENCOUNTER — Ambulatory Visit (INDEPENDENT_AMBULATORY_CARE_PROVIDER_SITE_OTHER): Payer: Medicare Other | Admitting: *Deleted

## 2011-12-11 DIAGNOSIS — I4891 Unspecified atrial fibrillation: Secondary | ICD-10-CM

## 2011-12-11 DIAGNOSIS — Z7901 Long term (current) use of anticoagulants: Secondary | ICD-10-CM

## 2011-12-11 LAB — POCT INR: INR: 3.8

## 2012-01-04 ENCOUNTER — Ambulatory Visit (INDEPENDENT_AMBULATORY_CARE_PROVIDER_SITE_OTHER): Payer: Medicare Other | Admitting: *Deleted

## 2012-01-04 DIAGNOSIS — Z7901 Long term (current) use of anticoagulants: Secondary | ICD-10-CM

## 2012-01-04 DIAGNOSIS — I4891 Unspecified atrial fibrillation: Secondary | ICD-10-CM

## 2012-01-04 LAB — POCT INR: INR: 2.4

## 2012-01-17 ENCOUNTER — Other Ambulatory Visit: Payer: Self-pay | Admitting: Cardiology

## 2012-02-01 ENCOUNTER — Encounter: Payer: Self-pay | Admitting: *Deleted

## 2012-02-01 ENCOUNTER — Ambulatory Visit (INDEPENDENT_AMBULATORY_CARE_PROVIDER_SITE_OTHER): Payer: Medicare Other | Admitting: *Deleted

## 2012-02-01 DIAGNOSIS — Z7901 Long term (current) use of anticoagulants: Secondary | ICD-10-CM

## 2012-02-01 DIAGNOSIS — I4891 Unspecified atrial fibrillation: Secondary | ICD-10-CM

## 2012-02-01 NOTE — Progress Notes (Signed)
Cant close this °

## 2012-02-07 NOTE — Progress Notes (Signed)
I can not close this Nathan Zimmerman.

## 2012-02-15 NOTE — Progress Notes (Signed)
Sorry Misty Stanley, but I still can not close this one.

## 2012-03-04 NOTE — Progress Notes (Signed)
I still cannot get this off of my computer

## 2012-03-07 ENCOUNTER — Encounter: Payer: Self-pay | Admitting: Cardiology

## 2012-03-07 ENCOUNTER — Ambulatory Visit (INDEPENDENT_AMBULATORY_CARE_PROVIDER_SITE_OTHER): Payer: Medicare Other | Admitting: *Deleted

## 2012-03-07 DIAGNOSIS — I4891 Unspecified atrial fibrillation: Secondary | ICD-10-CM

## 2012-03-07 DIAGNOSIS — Z7901 Long term (current) use of anticoagulants: Secondary | ICD-10-CM

## 2012-03-07 LAB — POCT INR: INR: 1.7

## 2012-03-31 ENCOUNTER — Ambulatory Visit: Payer: Medicare Other | Admitting: Cardiology

## 2012-04-01 ENCOUNTER — Ambulatory Visit (INDEPENDENT_AMBULATORY_CARE_PROVIDER_SITE_OTHER): Payer: Medicare Other | Admitting: *Deleted

## 2012-04-01 ENCOUNTER — Other Ambulatory Visit: Payer: Self-pay | Admitting: Cardiology

## 2012-04-01 DIAGNOSIS — Z7901 Long term (current) use of anticoagulants: Secondary | ICD-10-CM

## 2012-04-01 DIAGNOSIS — I4891 Unspecified atrial fibrillation: Secondary | ICD-10-CM

## 2012-05-07 ENCOUNTER — Ambulatory Visit (INDEPENDENT_AMBULATORY_CARE_PROVIDER_SITE_OTHER): Payer: Medicare Other | Admitting: *Deleted

## 2012-05-07 ENCOUNTER — Telehealth: Payer: Self-pay | Admitting: *Deleted

## 2012-05-07 DIAGNOSIS — Z7901 Long term (current) use of anticoagulants: Secondary | ICD-10-CM

## 2012-05-07 DIAGNOSIS — I4891 Unspecified atrial fibrillation: Secondary | ICD-10-CM

## 2012-05-07 NOTE — Telephone Encounter (Signed)
PT - 40.5 and INR - 3.4 / please call Debbie with instructions / pt was not  gonna be able to make appt in [place] on Friday. / tgs

## 2012-05-07 NOTE — Telephone Encounter (Signed)
See coumadin note. 

## 2012-05-19 NOTE — Progress Notes (Signed)
I cannot close his chart. There are some smart  phrases or something else still open

## 2012-05-20 ENCOUNTER — Ambulatory Visit: Payer: Medicare Other | Admitting: Cardiology

## 2012-05-30 ENCOUNTER — Ambulatory Visit (INDEPENDENT_AMBULATORY_CARE_PROVIDER_SITE_OTHER): Payer: Medicare Other | Admitting: *Deleted

## 2012-05-30 DIAGNOSIS — Z7901 Long term (current) use of anticoagulants: Secondary | ICD-10-CM

## 2012-05-30 DIAGNOSIS — I4891 Unspecified atrial fibrillation: Secondary | ICD-10-CM

## 2012-07-04 ENCOUNTER — Ambulatory Visit (INDEPENDENT_AMBULATORY_CARE_PROVIDER_SITE_OTHER): Payer: Medicare Other | Admitting: *Deleted

## 2012-07-04 DIAGNOSIS — I4891 Unspecified atrial fibrillation: Secondary | ICD-10-CM

## 2012-07-04 DIAGNOSIS — Z7901 Long term (current) use of anticoagulants: Secondary | ICD-10-CM

## 2012-07-04 LAB — POCT INR: INR: 3.3

## 2012-07-10 ENCOUNTER — Encounter: Payer: Self-pay | Admitting: Cardiology

## 2012-07-10 ENCOUNTER — Encounter: Payer: Self-pay | Admitting: *Deleted

## 2012-07-10 ENCOUNTER — Ambulatory Visit (INDEPENDENT_AMBULATORY_CARE_PROVIDER_SITE_OTHER): Payer: Medicare Other | Admitting: Cardiology

## 2012-07-10 VITALS — BP 130/70 | HR 102 | Ht 73.0 in | Wt 298.0 lb

## 2012-07-10 DIAGNOSIS — I4891 Unspecified atrial fibrillation: Secondary | ICD-10-CM

## 2012-07-10 DIAGNOSIS — I5022 Chronic systolic (congestive) heart failure: Secondary | ICD-10-CM

## 2012-07-10 DIAGNOSIS — E782 Mixed hyperlipidemia: Secondary | ICD-10-CM

## 2012-07-10 NOTE — Patient Instructions (Signed)
Continue all current medications. Your physician wants you to follow up in: 6 months.  You will receive a reminder letter in the mail one-two months in advance.  If you don't receive a letter, please call our office to schedule the follow up appointment   

## 2012-07-10 NOTE — Progress Notes (Signed)
Clinical Summary Mr. Molinelli is a 72 y.o.male last seen in July 2013. He is here with his wife today. Uses a wheelchair most of the time now with difficulty ambulating. He reports compliance with his medications, no major bleeding issues with Coumadin. He continues to follow through our Coumadin clinic. His wife asked about possibly trying to arrange home Coumadin checks due to difficulty getting him into the office.  He reports no sense of palpitations, stable NYHA class 2-3 dyspnea which has been chronic. Has intermittent leg edema.  He continues to follow with Dr. Olena Leatherwood.   No Known Allergies  Current Outpatient Prescriptions  Medication Sig Dispense Refill  . benazepril (LOTENSIN) 20 MG tablet Take 20 tablets by mouth Daily.      Marland Kitchen diltiazem (CARDIZEM CD) 240 MG 24 hr capsule TAKE ONE CAPSULE ONCE DAILY  30 capsule  6  . furosemide (LASIX) 40 MG tablet Take 80 mg by mouth 2 (two) times daily. And one in evening      . gabapentin (NEURONTIN) 300 MG capsule Take 600 mg by mouth 3 (three) times daily.       Marland Kitchen gemfibrozil (LOPID) 600 MG tablet Take 600 tablets by mouth Twice daily.      Marland Kitchen glimepiride (AMARYL) 2 MG tablet Take 2 tablets by mouth Daily.      Marland Kitchen HYDROcodone-acetaminophen (NORCO) 10-325 MG per tablet Take 1 tablet by mouth 3 (three) times daily.       . Insulin Isophane & Regular (NOVOLIN 70/30 [place]) Inject 28 Units into the skin 2 (two) times daily.       . metFORMIN (GLUCOPHAGE) 500 MG tablet Take 500 mg by mouth Twice daily.       . metoprolol (TOPROL-XL) 200 MG 24 hr tablet TAKE ONE TABLET BY MOUTH ONCE DAILY.  30 tablet  12  . Omega-3 Fatty Acids (FISH OIL) 1000 MG CAPS Take 1 capsule by mouth 3 (three) times daily.       . potassium chloride SA (K-DUR,KLOR-CON) 20 MEQ tablet TAKE ONE TABLET ONCE DAILY  30 tablet  6  . warfarin (COUMADIN) 5 MG tablet Take by mouth as directed. Managed by Dr. Olena Leatherwood       No current facility-administered medications for this visit.     Past Medical History  Diagnosis Date  . Atrial fibrillation   . COPD (chronic obstructive pulmonary disease)   . Diabetes mellitus, type 2   . Stroke   . 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 Arthritis   . Renal insufficiency     Social History Mr. Kotlyar reports that he quit smoking about 4 years ago. His smoking use included Cigarettes. He has a 120 pack-year smoking history. His smokeless tobacco use includes Chew. Mr. Mattix reports that he does not drink alcohol.  Review of Systems No dizziness or syncope. No chest pain. Stable appetite. Otherwise negative.  Physical Examination Filed Vitals:   07/10/12 1359  BP: 130/70  Pulse: 102   Filed Weights   07/10/12 1359  Weight: 298 lb (135.172 kg)    Obese male in no acute distress, sitting in wheelchair.  HEENT: Conjunctivae and lids normal, oropharynx with poor dentition.  Neck: Supple, no loud bruits.  Lungs: Diminished breath sounds throughout with scattered rhonchi, no active wheezing but prolonged expiratory phase.  Cardiac: Irregularly irregular, distant, no S3.  Abdomen: Soft, nontender.  Extremities: Chronic appearing edema,1+. Distal pulses 1+.  Skin: Some venous stasis distally.  Neuropsychiatric: Alert and oriented x3, affect appropriate.   Problem List and Plan   ATRIAL FIBRILLATION Heart rate rechecked by me in the 90s. He reports compliance with his medications including rate control and Coumadin. No specific changes made today.  Chronic systolic heart failure Weight continues to go up, but he describes a healthy appetite, overeating usually. He is already on relatively high-dose Lasix and has CKD at baseline.  MIXED HYPERLIPIDEMIA Followed by Dr. Olena Leatherwood, on Lopid and omega-3 supplements.    Jonelle Sidle, M.D., F.A.C.C.

## 2012-07-10 NOTE — Assessment & Plan Note (Signed)
Weight continues to go up, but he describes a healthy appetite, overeating usually. He is already on relatively high-dose Lasix and has CKD at baseline.

## 2012-07-10 NOTE — Assessment & Plan Note (Signed)
Heart rate rechecked by me in the 90s. He reports compliance with his medications including rate control and Coumadin. No specific changes made today.

## 2012-07-10 NOTE — Assessment & Plan Note (Signed)
Followed by Dr. Olena Leatherwood, on Lopid and omega-3 supplements.

## 2012-08-01 ENCOUNTER — Ambulatory Visit (INDEPENDENT_AMBULATORY_CARE_PROVIDER_SITE_OTHER): Payer: Medicare Other | Admitting: *Deleted

## 2012-08-01 DIAGNOSIS — Z7901 Long term (current) use of anticoagulants: Secondary | ICD-10-CM

## 2012-08-01 DIAGNOSIS — I4891 Unspecified atrial fibrillation: Secondary | ICD-10-CM

## 2012-08-12 ENCOUNTER — Other Ambulatory Visit: Payer: Self-pay | Admitting: Cardiology

## 2012-09-12 ENCOUNTER — Ambulatory Visit (INDEPENDENT_AMBULATORY_CARE_PROVIDER_SITE_OTHER): Payer: Medicare Other | Admitting: *Deleted

## 2012-09-12 DIAGNOSIS — I4891 Unspecified atrial fibrillation: Secondary | ICD-10-CM

## 2012-09-12 DIAGNOSIS — Z7901 Long term (current) use of anticoagulants: Secondary | ICD-10-CM

## 2012-09-12 LAB — POCT INR: INR: 2.5

## 2012-09-29 ENCOUNTER — Telehealth: Payer: Self-pay | Admitting: Cardiology

## 2012-09-29 NOTE — Telephone Encounter (Signed)
Case workers called questioning if the coumadin checks can be done in the home by home health nurse. Can call Nathan Zimmerman back at [phone].

## 2012-09-29 NOTE — Telephone Encounter (Signed)
Spoke with Laney Potash RN [facility].  They are seeing pt Q 2 months for CAP visit.  They are seeing pt tomorrow and will check INR then.  Pt informed of tomorrows visit and the fact if INR is out of range he may have to come in to the office sooner.  He is in agreement.

## 2012-09-30 ENCOUNTER — Telehealth: Payer: Self-pay | Admitting: *Deleted

## 2012-09-30 ENCOUNTER — Ambulatory Visit (INDEPENDENT_AMBULATORY_CARE_PROVIDER_SITE_OTHER): Payer: Medicare Other | Admitting: *Deleted

## 2012-09-30 DIAGNOSIS — I4891 Unspecified atrial fibrillation: Secondary | ICD-10-CM

## 2012-09-30 DIAGNOSIS — Z7901 Long term (current) use of anticoagulants: Secondary | ICD-10-CM

## 2012-09-30 LAB — POCT INR: INR: 3.2

## 2012-09-30 NOTE — Telephone Encounter (Signed)
See coumadin note. 

## 2012-09-30 NOTE — Telephone Encounter (Signed)
Currently taking 5 mg Sun-Tue-Wed-Fri-Sat and 2.5 mg Mon and Thursday.

## 2012-10-27 ENCOUNTER — Other Ambulatory Visit: Payer: Self-pay | Admitting: Cardiology

## 2012-10-29 ENCOUNTER — Ambulatory Visit (INDEPENDENT_AMBULATORY_CARE_PROVIDER_SITE_OTHER): Payer: Medicare Other | Admitting: Cardiovascular Disease

## 2012-10-29 DIAGNOSIS — Z7901 Long term (current) use of anticoagulants: Secondary | ICD-10-CM

## 2012-10-29 DIAGNOSIS — I4891 Unspecified atrial fibrillation: Secondary | ICD-10-CM

## 2012-10-29 LAB — POCT INR: INR: 3

## 2012-11-12 ENCOUNTER — Other Ambulatory Visit: Payer: Self-pay | Admitting: Cardiology

## 2012-11-18 ENCOUNTER — Telehealth: Payer: Self-pay | Admitting: Cardiology

## 2012-11-18 MED ORDER — METOPROLOL SUCCINATE ER 200 MG PO TB24: 200.0000 mg | ORAL_TABLET | Freq: Every day | ORAL | Status: DC

## 2012-11-18 NOTE — Telephone Encounter (Signed)
Nathan Zimmerman has questions about his medication . Please speak with [place] .  # [phone]

## 2012-12-01 ENCOUNTER — Telehealth: Payer: Self-pay | Admitting: Cardiology

## 2012-12-01 ENCOUNTER — Ambulatory Visit (INDEPENDENT_AMBULATORY_CARE_PROVIDER_SITE_OTHER): Payer: Medicare Other | Admitting: *Deleted

## 2012-12-01 DIAGNOSIS — Z7901 Long term (current) use of anticoagulants: Secondary | ICD-10-CM

## 2012-12-01 DIAGNOSIS — I4891 Unspecified atrial fibrillation: Secondary | ICD-10-CM

## 2012-12-01 LAB — POCT INR: INR: 4.2

## 2012-12-01 NOTE — Telephone Encounter (Signed)
INR 4.2  Still at patient's house wanting to know if she needs to draw blood

## 2012-12-01 NOTE — Telephone Encounter (Signed)
See coumadin note. 

## 2012-12-11 ENCOUNTER — Other Ambulatory Visit: Payer: Self-pay | Admitting: Cardiology

## 2012-12-31 ENCOUNTER — Ambulatory Visit (INDEPENDENT_AMBULATORY_CARE_PROVIDER_SITE_OTHER): Payer: Medicare Other | Admitting: *Deleted

## 2012-12-31 DIAGNOSIS — I4891 Unspecified atrial fibrillation: Secondary | ICD-10-CM

## 2012-12-31 DIAGNOSIS — Z7901 Long term (current) use of anticoagulants: Secondary | ICD-10-CM

## 2012-12-31 LAB — POCT INR: INR: 2

## 2013-01-06 ENCOUNTER — Encounter: Payer: Self-pay | Admitting: *Deleted

## 2013-01-06 ENCOUNTER — Ambulatory Visit (INDEPENDENT_AMBULATORY_CARE_PROVIDER_SITE_OTHER): Payer: Medicare Other | Admitting: Cardiology

## 2013-01-06 ENCOUNTER — Encounter: Payer: Self-pay | Admitting: Cardiology

## 2013-01-06 VITALS — BP 136/80 | HR 88 | Ht 74.0 in | Wt 302.1 lb

## 2013-01-06 DIAGNOSIS — I4891 Unspecified atrial fibrillation: Secondary | ICD-10-CM

## 2013-01-06 DIAGNOSIS — I5022 Chronic systolic (congestive) heart failure: Secondary | ICD-10-CM

## 2013-01-06 NOTE — Assessment & Plan Note (Signed)
Continue on current regimen for rate control. Coumadin is being checked at home and routed through our Coumadin clinic. No major bleeding problems.

## 2013-01-06 NOTE — Patient Instructions (Signed)

## 2013-01-06 NOTE — Assessment & Plan Note (Signed)
Cardiomyopathy with LVEF 45%. Symptomatically stable. Continue current regimen. 

## 2013-01-06 NOTE — Progress Notes (Signed)
Clinical Summary Nathan Zimmerman is a 72 y.o.male last seen in April. He reports no major changes. Still very functionally limited due to chronic knee and back pain. He uses a wheelchair most of the time. He continues to follow with Dr. Olena Leatherwood on a three-month basis. Coumadin is now checked at home and routed through our Coumadin clinic. Most recent INR 2.0. He denies any significant palpitations and has had no major bleeding problems.   No Known Allergies  Current Outpatient Prescriptions  Medication Sig Dispense Refill  . benazepril (LOTENSIN) 20 MG tablet Take 20 tablets by mouth Daily.      Marland Kitchen diltiazem (CARDIZEM CD) 240 MG 24 hr capsule TAKE ONE CAPSULE BY MOUTH ONCE DAILY  30 capsule  6  . furosemide (LASIX) 40 MG tablet Take 80 mg by mouth 2 (two) times daily. And one in evening      . gabapentin (NEURONTIN) 300 MG capsule Take 600 mg by mouth 3 (three) times daily.       Marland Kitchen gemfibrozil (LOPID) 600 MG tablet Take 600 tablets by mouth Twice daily.      Marland Kitchen glimepiride (AMARYL) 2 MG tablet Take 2 tablets by mouth Daily.      Marland Kitchen HYDROcodone-acetaminophen (NORCO) 10-325 MG per tablet Take 1 tablet by mouth 3 (three) times daily.       . Insulin Isophane & Regular (NOVOLIN 70/30 [place]) Inject 28 Units into the skin 2 (two) times daily.       . metFORMIN (GLUCOPHAGE) 500 MG tablet Take 500 mg by mouth Twice daily.       . metoprolol (TOPROL-XL) 200 MG 24 hr tablet Take 1 tablet (200 mg total) by mouth daily.  30 tablet  6  . Omega-3 Fatty Acids (FISH OIL) 1000 MG CAPS Take 1 capsule by mouth 3 (three) times daily.       . potassium chloride SA (K-DUR,KLOR-CON) 20 MEQ tablet TAKE ONE TABLET BY MOUTH ONCE DAILY  30 tablet  6  . warfarin (COUMADIN) 5 MG tablet Take by mouth as directed. Managed by Dr. Olena Leatherwood       No current facility-administered medications for this visit.    Past Medical History  Diagnosis Date  . Atrial fibrillation   . COPD (chronic obstructive pulmonary disease)   .  Diabetes mellitus, type 2   . Stroke   . 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 Arthritis   . Renal insufficiency     Social History Nathan Zimmerman reports that he quit smoking about 4 years ago. His smoking use included Cigarettes. He has a 120 pack-year smoking history. His smokeless tobacco use includes Chew. Nathan Zimmerman reports that he does not drink alcohol.  Review of Systems No falls. Stable appetite. No cough. Otherwise negative.  Physical Examination Filed Vitals:   01/06/13 1449  BP: 136/80  Pulse: 88   Filed Weights   01/06/13 1449  Weight: 302 lb 1.9 oz (137.041 kg)    Obese male in no acute distress, sitting in wheelchair.  HEENT: Conjunctivae and lids normal, oropharynx with poor dentition.  Neck: Supple, no loud bruits.  Lungs: Diminished breath sounds throughout with scattered rhonchi, no active wheezing but prolonged expiratory phase.  Cardiac: Irregularly irregular, distant, no S3.  Abdomen: Soft, nontender.  Extremities: Chronic appearing edema,1+. Distal pulses 1+.    Problem List and Plan   Atrial fibrillation Continue on current regimen for rate control. Coumadin is being checked at home  and routed through our Coumadin clinic. No major bleeding problems.  Chronic systolic heart failure Cardiomyopathy with LVEF 45%. Symptomatically stable. Continue current regimen.    Jonelle Sidle, M.D., F.A.C.C.

## 2013-01-30 ENCOUNTER — Ambulatory Visit (INDEPENDENT_AMBULATORY_CARE_PROVIDER_SITE_OTHER): Payer: Medicare Other | Admitting: *Deleted

## 2013-01-30 DIAGNOSIS — Z7901 Long term (current) use of anticoagulants: Secondary | ICD-10-CM

## 2013-01-30 DIAGNOSIS — I4891 Unspecified atrial fibrillation: Secondary | ICD-10-CM

## 2013-02-09 ENCOUNTER — Other Ambulatory Visit: Payer: Self-pay | Admitting: Cardiology

## 2013-02-25 ENCOUNTER — Ambulatory Visit (INDEPENDENT_AMBULATORY_CARE_PROVIDER_SITE_OTHER): Payer: Medicare Other | Admitting: *Deleted

## 2013-02-25 DIAGNOSIS — I4891 Unspecified atrial fibrillation: Secondary | ICD-10-CM

## 2013-02-25 DIAGNOSIS — Z7901 Long term (current) use of anticoagulants: Secondary | ICD-10-CM

## 2013-02-25 LAB — POCT INR: INR: 2.9

## 2013-04-02 ENCOUNTER — Ambulatory Visit (INDEPENDENT_AMBULATORY_CARE_PROVIDER_SITE_OTHER): Payer: Medicare Other | Admitting: *Deleted

## 2013-04-02 ENCOUNTER — Telehealth: Payer: Self-pay | Admitting: *Deleted

## 2013-04-02 DIAGNOSIS — I4891 Unspecified atrial fibrillation: Secondary | ICD-10-CM

## 2013-04-02 DIAGNOSIS — Z7901 Long term (current) use of anticoagulants: Secondary | ICD-10-CM

## 2013-04-02 LAB — POCT INR: INR: 2.1

## 2013-04-02 NOTE — Telephone Encounter (Signed)
INR 2.1 PT 25.4

## 2013-04-02 NOTE — Telephone Encounter (Signed)
See coumadin note. 

## 2013-04-18 ENCOUNTER — Emergency Department (HOSPITAL_COMMUNITY)
Admission: EM | Admit: 2013-04-18 | Discharge: 2013-04-18 | Disposition: A | Discharge status: Home / Self Care | Payer: Medicare Other | Attending: Emergency Medicine | Admitting: Emergency Medicine

## 2013-04-18 ENCOUNTER — Encounter (HOSPITAL_COMMUNITY): Payer: Self-pay | Admitting: Emergency Medicine

## 2013-04-18 DIAGNOSIS — Z8739 Personal history of other diseases of the musculoskeletal system and connective tissue: Secondary | ICD-10-CM | POA: Insufficient documentation

## 2013-04-18 DIAGNOSIS — J4489 Other specified chronic obstructive pulmonary disease: Secondary | ICD-10-CM | POA: Insufficient documentation

## 2013-04-18 DIAGNOSIS — Z79899 Other long term (current) drug therapy: Secondary | ICD-10-CM | POA: Insufficient documentation

## 2013-04-18 DIAGNOSIS — Z7901 Long term (current) use of anticoagulants: Secondary | ICD-10-CM | POA: Insufficient documentation

## 2013-04-18 DIAGNOSIS — Z794 Long term (current) use of insulin: Secondary | ICD-10-CM | POA: Insufficient documentation

## 2013-04-18 DIAGNOSIS — S92912A Unspecified fracture of left toe(s), initial encounter for closed fracture: Secondary | ICD-10-CM

## 2013-04-18 DIAGNOSIS — S91109A Unspecified open wound of unspecified toe(s) without damage to nail, initial encounter: Secondary | ICD-10-CM | POA: Insufficient documentation

## 2013-04-18 DIAGNOSIS — Y939 Activity, unspecified: Secondary | ICD-10-CM | POA: Insufficient documentation

## 2013-04-18 DIAGNOSIS — E119 Type 2 diabetes mellitus without complications: Secondary | ICD-10-CM | POA: Insufficient documentation

## 2013-04-18 DIAGNOSIS — Z8673 Personal history of transient ischemic attack (TIA), and cerebral infarction without residual deficits: Secondary | ICD-10-CM | POA: Insufficient documentation

## 2013-04-18 DIAGNOSIS — Z87448 Personal history of other diseases of urinary system: Secondary | ICD-10-CM | POA: Insufficient documentation

## 2013-04-18 DIAGNOSIS — S92919B Unspecified fracture of unspecified toe(s), initial encounter for open fracture: Secondary | ICD-10-CM | POA: Insufficient documentation

## 2013-04-18 DIAGNOSIS — Z87891 Personal history of nicotine dependence: Secondary | ICD-10-CM | POA: Insufficient documentation

## 2013-04-18 DIAGNOSIS — W1809XA Striking against other object with subsequent fall, initial encounter: Secondary | ICD-10-CM | POA: Insufficient documentation

## 2013-04-18 DIAGNOSIS — IMO0002 Reserved for concepts with insufficient information to code with codable children: Secondary | ICD-10-CM

## 2013-04-18 DIAGNOSIS — J449 Chronic obstructive pulmonary disease, unspecified: Secondary | ICD-10-CM | POA: Insufficient documentation

## 2013-04-18 DIAGNOSIS — Y92009 Unspecified place in unspecified non-institutional (private) residence as the place of occurrence of the external cause: Secondary | ICD-10-CM | POA: Insufficient documentation

## 2013-04-18 DIAGNOSIS — I4891 Unspecified atrial fibrillation: Secondary | ICD-10-CM | POA: Insufficient documentation

## 2013-04-18 MED ORDER — OXYCODONE-ACETAMINOPHEN 5-325 MG PO TABS: 2.0000 | ORAL_TABLET | ORAL | Status: DC | PRN

## 2013-04-18 MED ORDER — DOXYCYCLINE HYCLATE 100 MG PO CAPS: 100.0000 mg | ORAL_CAPSULE | Freq: Two times a day (BID) | ORAL | Status: DC

## 2013-04-18 NOTE — ED Notes (Signed)
MD at bedside. 

## 2013-04-18 NOTE — Discharge Instructions (Signed)
Wear fracture boot as directed and follow up with DR Roda Shutters in 9 days  Laceration Care, Adult    A laceration is a cut or lesion that goes through all layers of the skin and into the tissue just beneath the skin.  TREATMENT  Some lacerations may not require closure. Some lacerations may not be able to be closed due to an increased risk of infection. It is important to see your caregiver as soon as possible after an injury to minimize the risk of infection and maximize the opportunity for successful closure.  If closure is appropriate, pain medicines may be given, if needed. The wound will be cleaned to help prevent infection. Your caregiver will use stitches (sutures), staples, wound glue (adhesive), or skin adhesive strips to repair the laceration. These tools bring the skin edges together to allow for faster healing and a better cosmetic outcome. However, all wounds will heal with a scar. Once the wound has healed, scarring can be minimized by covering the wound with sunscreen during the day for 1 full year.  HOME CARE INSTRUCTIONS  For sutures or staples:  Keep the wound clean and dry.  If you were given a bandage (dressing), you should change it at least once a day. Also, change the dressing if it becomes wet or dirty, or as directed by your caregiver.  Wash the wound with soap and water 2 times a day. Rinse the wound off with water to remove all soap. Pat the wound dry with a clean towel.  After cleaning, apply a thin layer of the antibiotic ointment as recommended by your caregiver. This will help prevent infection and keep the dressing from sticking.  You may shower as usual after the first 24 hours. Do not soak the wound in water until the sutures are removed.  Only take over-the-counter or prescription medicines for pain, discomfort, or fever as directed by your caregiver.  Get your sutures or staples removed as directed by your caregiver. For skin adhesive strips:  Keep the wound clean and  dry.  Do not get the skin adhesive strips wet. You may bathe carefully, using caution to keep the wound dry.  If the wound gets wet, pat it dry with a clean towel.  Skin adhesive strips will fall off on their own. You may trim the strips as the wound heals. Do not remove skin adhesive strips that are still stuck to the wound. They will fall off in time. For wound adhesive:  You may briefly wet your wound in the shower or bath. Do not soak or scrub the wound. Do not swim. Avoid periods of heavy perspiration until the skin adhesive has fallen off on its own. After showering or bathing, gently pat the wound dry with a clean towel.  Do not apply liquid medicine, cream medicine, or ointment medicine to your wound while the skin adhesive is in place. This may loosen the film before your wound is healed.  If a dressing is placed over the wound, be careful not to apply tape directly over the skin adhesive. This may cause the adhesive to be pulled off before the wound is healed.  Avoid prolonged exposure to sunlight or tanning lamps while the skin adhesive is in place. Exposure to ultraviolet light in the first year will darken the scar.  The skin adhesive will usually remain in place for 5 to 10 days, then naturally fall off the skin. Do not pick at the adhesive film. You may need a  tetanus shot if:  You cannot remember when you had your last tetanus shot.  You have never had a tetanus shot. If you get a tetanus shot, your arm may swell, get red, and feel warm to the touch. This is common and not a problem. If you need a tetanus shot and you choose not to have one, there is a rare chance of getting tetanus. Sickness from tetanus can be serious.  SEEK MEDICAL CARE IF:  You have redness, swelling, or increasing pain in the wound.  You see a red line that goes away from the wound.  You have yellowish-white fluid (pus) coming from the wound.  You have a fever.  You notice a bad smell coming from the wound or  dressing.  Your wound breaks open before or after sutures have been removed.  You notice something coming out of the wound such as wood or glass.  Your wound is on your hand or foot and you cannot move a finger or toe. SEEK IMMEDIATE MEDICAL CARE IF:  Your pain is not controlled with prescribed medicine.  You have severe swelling around the wound causing pain and numbness or a change in color in your arm, hand, leg, or foot.  Your wound splits open and starts bleeding.  You have worsening numbness, weakness, or loss of function of any joint around or beyond the wound.  You develop painful lumps near the wound or on the skin anywhere on your body. MAKE SURE YOU:  Understand these instructions.  Will watch your condition.  Will get help right away if you are not doing well or get worse. Document Released: 03/05/2005 Document Revised: 05/28/2011 Document Reviewed: 08/29/2010  [facility] Patient Information 2014 [employer] [place].

## 2013-04-18 NOTE — ED Notes (Signed)
Ortho tech applied boot to affected foot

## 2013-04-18 NOTE — ED Notes (Signed)
Pt at home fell against his dresser sustain rt great toe fx with lac between the two toes.  Pt  At [facility] received 3gm iv ancef and tetanus shot, hx arithitis htn, cva dm.

## 2013-04-18 NOTE — ED Provider Notes (Signed)
CSN: [id number]     Arrival date & time 04/18/13  0156 History   First MD Initiated Contact with Patient 04/18/13 [phone]     Chief Complaint  Patient presents with  . Fall   (Consider location/radiation/quality/duration/timing/severity/associated sxs/prior Treatment) HPI Hx per patient. Fell against his dresser at home and sustained open fracture left great toe. Evaluated at outside Hospital and sent here to see orthopedic surgeon Dr. Roda ShuttersXu. At time of evaluation he is resting comfortably without pain. He was having her sharp pain. He had received antibiotics and tetanus shot prior to arrival. Past Medical History  Diagnosis Date  . Atrial fibrillation   . COPD (chronic obstructive pulmonary disease)   . Diabetes mellitus, type 2   . Stroke   . 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 Arthritis   . Renal insufficiency    Past Surgical History  Procedure Laterality Date  . Femoral hernia repair     No family history on file. History  Substance Use Topics  . Smoking status: Former Smoker -- 2.00 packs/day for 60 years    Types: Cigarettes    Quit date: 06/17/2008  . Smokeless tobacco: Current User    Types: Chew     Comment: chew one pack tobacco per day for most of life off and on  . Alcohol Use: No    Review of Systems  Constitutional: Negative for fever and chills.  Respiratory: Negative for shortness of breath.   Cardiovascular: Negative for chest pain.  Gastrointestinal: Negative for vomiting and abdominal pain.  Musculoskeletal: Negative for back pain.  Skin: Positive for wound.  Neurological: Negative for weakness.  All other systems reviewed and are negative.    Allergies  Review of patient's allergies indicates no known allergies.  Home Medications   Current Outpatient Rx  Name  Route  Sig  Dispense  Refill  . atorvastatin (LIPITOR) 40 MG tablet   Oral   Take 40 mg by mouth daily.         . benazepril (LOTENSIN) 20 MG tablet    Oral   Take 20 tablets by mouth Daily.         Marland Kitchen. diltiazem (TIAZAC) 240 MG 24 hr capsule   Oral   Take 240 mg by mouth daily.         . furosemide (LASIX) 80 MG tablet   Oral   Take 80 mg by mouth 2 (two) times daily.         Marland Kitchen. gabapentin (NEURONTIN) 600 MG tablet   Oral   Take 600 mg by mouth 3 (three) times daily.         Marland Kitchen. gemfibrozil (LOPID) 600 MG tablet   Oral   Take 600 tablets by mouth Twice daily.         Marland Kitchen. glimepiride (AMARYL) 2 MG tablet   Oral   Take 2 mg by mouth Daily.          . insulin NPH-regular Human (NOVOLIN 70/30) (70-30) 100 UNIT/ML injection   Subcutaneous   Inject 8 Units into the skin 2 (two) times daily with a meal.         . metFORMIN (GLUCOPHAGE) 500 MG tablet   Oral   Take 500 mg by mouth Twice daily.          . metoprolol (TOPROL-XL) 200 MG 24 hr tablet   Oral   Take 1 tablet (200 mg total) by mouth daily.   30  tablet   6   . Omega-3 Fatty Acids (FISH OIL) 1000 MG CAPS   Oral   Take 1 capsule by mouth 3 (three) times daily.          . potassium chloride SA (K-DUR,KLOR-CON) 20 MEQ tablet   Oral   Take 20 mEq by mouth daily.         Marland Kitchen warfarin (COUMADIN) 5 MG tablet   Oral   Take 0.5-1 mg by mouth daily. Take 1/2 tablet on Tuesday, Thursday and Saturday then take 1 tablet the other days          BP 110/60  Pulse 99  Temp(Src) 98.5 F (36.9 C) (Oral)  Resp 18  SpO2 99% Physical Exam  Constitutional: He is oriented to person, place, and time. He appears well-developed and well-nourished.  HENT:  Head: Normocephalic and atraumatic.  Eyes: EOM are normal. Pupils are equal, round, and reactive to light.  Neck: Neck supple.  Cardiovascular: Regular rhythm and intact distal pulses.   Pulmonary/Chest: Effort normal. No respiratory distress.  Musculoskeletal:  L foot web space lac  Neurological: He is alert and oriented to person, place, and time.  Skin: Skin is warm and dry.    ED Course  Procedures  (including critical care time) Labs Review Labs Reviewed - No data to display Imaging Review No results found.  Outside records reviewed. Pain medications offered/ declined  Dr Roda Shutters evaluated bedside, wound care was performed, placed in a postop shoe.   Plan discharge home with prescription for Percocet, doxycycline and followup orthopedics in 9 days MDM  Dx: L great toe lac with Fx  Ortho consult Outside records reviewed Vs and nurses notes reviewed    Sunnie Nielsen, MD 04/18/13 0600

## 2013-04-18 NOTE — Consult Note (Signed)
ORTHOPAEDIC CONSULTATION  REQUESTING PHYSICIAN: ER  Chief Complaint: Left great toe injury  HPI: Nathan Zimmerman is a 73 y.o. male who complains of left great toe injury s/p striking toe into the side of a cabinet.  Was [place] in [place] and sent here for ortho eval and treatment.  Patient has peripheral neuropathy from DM.  Denies any other injuries at this time.  Past Medical History  Diagnosis Date  . Atrial fibrillation   . COPD (chronic obstructive pulmonary disease)   . Diabetes mellitus, type 2   . Stroke   . 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 Arthritis   . Renal insufficiency    Past Surgical History  Procedure Laterality Date  . Femoral hernia repair     History   Social History  . Marital Status: Married    Spouse Name: N/A    Number of Children: N/A  . Years of Education: N/A   Occupational History  . Retired Soil scientistlogger    Social History Main Topics  . Smoking status: Former Smoker -- 2.00 packs/day for 60 years    Types: Cigarettes    Quit date: 06/17/2008  . Smokeless tobacco: Current User    Types: Chew     Comment: chew one pack tobacco per day for most of life off and on  . Alcohol Use: No  . Drug Use: No  . Sexual Activity: None   Other Topics Concern  . None   Social History Narrative   [place]   No family history on file. No Known Allergies Prior to Admission medications   Medication Sig Start Date End Date Taking? Authorizing Provider  atorvastatin (LIPITOR) 40 MG tablet Take 40 mg by mouth daily.   Yes Historical Provider, MD  benazepril (LOTENSIN) 20 MG tablet Take 20 tablets by mouth Daily. 04/25/10  Yes Historical Provider, MD  diltiazem (TIAZAC) 240 MG 24 hr capsule Take 240 mg by mouth daily.   Yes Historical Provider, MD  furosemide (LASIX) 80 MG tablet Take 80 mg by mouth 2 (two) times daily.   Yes Historical Provider, MD  gabapentin (NEURONTIN) 600 MG tablet Take 600 mg by mouth 3 (three)  times daily.   Yes Historical Provider, MD  gemfibrozil (LOPID) 600 MG tablet Take 600 tablets by mouth Twice daily. 03/18/10  Yes Historical Provider, MD  glimepiride (AMARYL) 2 MG tablet Take 2 mg by mouth Daily.  05/12/10  Yes Historical Provider, MD  insulin NPH-regular Human (NOVOLIN 70/30) (70-30) 100 UNIT/ML injection Inject 8 Units into the skin 2 (two) times daily with a meal.   Yes Historical Provider, MD  metFORMIN (GLUCOPHAGE) 500 MG tablet Take 500 mg by mouth Twice daily.  03/18/10  Yes Historical Provider, MD  metoprolol (TOPROL-XL) 200 MG 24 hr tablet Take 1 tablet (200 mg total) by mouth daily. 11/18/12  Yes Jonelle SidleSamuel G McDowell, MD  Omega-3 Fatty Acids (FISH OIL) 1000 MG CAPS Take 1 capsule by mouth 3 (three) times daily.    Yes Historical Provider, MD  potassium chloride SA (K-DUR,KLOR-CON) 20 MEQ tablet Take 20 mEq by mouth daily.   Yes Historical Provider, MD  warfarin (COUMADIN) 5 MG tablet Take 0.5-1 mg by mouth daily. Take 1/2 tablet on Tuesday, Thursday and Saturday then take 1 tablet the other days   Yes Historical Provider, MD   No results found.  Positive ROS: All other systems have been reviewed and were otherwise negative with the  exception of those mentioned in the HPI and as above.  Physical Exam: General: Alert, no acute distress Cardiovascular: No pedal edema Respiratory: No cyanosis, no use of accessory musculature GI: No organomegaly, abdomen is soft and non-tender Skin: No lesions in the area of chief complaint Neurologic: Sensation decreased in bilateral feet Psychiatric: Patient is competent for consent with normal mood and affect Lymphatic: No axillary or cervical lymphadenopathy  MUSCULOSKELETAL:  Left foot - clean laceration in the 1st web space with exposed soft tissue - NV bundle intact - great toe wwp - no exposed bone   Assessment: Great toe laceration with underlying fracture  Plan: - mechanism is likely an outside in and not an open  fracture - wound was locally I&D'ed in the ED by ortho and closed with nylon sutures - recommend postop shoe - doxy 100 BID x 14 days - pain meds per ED - f/u 9-10 days from today in office  Thank you for the consult and the opportunity to see Mr. Adarryl Trottier. Glee Arvin, MD [facility] [phone] 5:43 AM

## 2013-04-30 ENCOUNTER — Encounter (HOSPITAL_COMMUNITY): Payer: Self-pay

## 2013-04-30 ENCOUNTER — Other Ambulatory Visit (HOSPITAL_COMMUNITY): Payer: Self-pay | Admitting: Orthopaedic Surgery

## 2013-04-30 ENCOUNTER — Encounter (HOSPITAL_COMMUNITY)
Admission: RE | Admit: 2013-04-30 | Discharge: 2013-04-30 | Disposition: A | Discharge status: Home / Self Care | Payer: Medicare Other | Source: Ambulatory Visit | Attending: Orthopaedic Surgery | Admitting: Orthopaedic Surgery

## 2013-04-30 ENCOUNTER — Ambulatory Visit (HOSPITAL_COMMUNITY)
Admission: RE | Admit: 2013-04-30 | Discharge: 2013-04-30 | Disposition: A | Discharge status: Home / Self Care | Payer: Medicare Other | Source: Ambulatory Visit | Attending: Anesthesiology | Admitting: Anesthesiology

## 2013-04-30 DIAGNOSIS — J9819 Other pulmonary collapse: Secondary | ICD-10-CM

## 2013-04-30 DIAGNOSIS — Z01810 Encounter for preprocedural cardiovascular examination: Secondary | ICD-10-CM | POA: Insufficient documentation

## 2013-04-30 DIAGNOSIS — I517 Cardiomegaly: Secondary | ICD-10-CM | POA: Insufficient documentation

## 2013-04-30 DIAGNOSIS — E119 Type 2 diabetes mellitus without complications: Secondary | ICD-10-CM | POA: Insufficient documentation

## 2013-04-30 DIAGNOSIS — Z01812 Encounter for preprocedural laboratory examination: Secondary | ICD-10-CM | POA: Insufficient documentation

## 2013-04-30 HISTORY — DX: Gastro-esophageal reflux disease without esophagitis: K21.9

## 2013-04-30 HISTORY — DX: Unspecified hearing loss, unspecified ear: H91.90

## 2013-04-30 HISTORY — DX: Essential (primary) hypertension: I10

## 2013-04-30 LAB — CBC
HCT: 44.9 % (ref 39.0–52.0)
Hemoglobin: 14.8 g/dL (ref 13.0–17.0)
MCH: 30 pg (ref 26.0–34.0)
MCHC: 33 g/dL (ref 30.0–36.0)
MCV: 91.1 fL (ref 78.0–100.0)
PLATELETS: 236 10*3/uL (ref 150–400)
RBC: 4.93 MIL/uL (ref 4.22–5.81)
RDW: 15.5 % (ref 11.5–15.5)
WBC: 6.3 10*3/uL (ref 4.0–10.5)

## 2013-04-30 LAB — BASIC METABOLIC PANEL
BUN: 20 mg/dL (ref 6–23)
CALCIUM: 9.5 mg/dL (ref 8.4–10.5)
CHLORIDE: 104 meq/L (ref 96–112)
CO2: 27 meq/L (ref 19–32)
CREATININE: 1.92 mg/dL — AB (ref 0.50–1.35)
GFR calc non Af Amer: 34 mL/min — ABNORMAL LOW (ref >90)
GFR, EST AFRICAN AMERICAN: 39 mL/min — AB (ref >90)
Glucose, Bld: 103 mg/dL — ABNORMAL HIGH (ref 70–99)
Potassium: 4.4 mEq/L (ref 3.7–5.3)
Sodium: 147 mEq/L (ref 137–147)

## 2013-04-30 LAB — SURGICAL PCR SCREEN
MRSA, PCR: NEGATIVE
STAPHYLOCOCCUS AUREUS: NEGATIVE

## 2013-04-30 NOTE — Pre-Procedure Instructions (Addendum)
Nathan Zimmerman  04/30/2013   Your procedure is scheduled on:  Friday, February 13.  Report to [facility], Main Entrance Entrance "A" at 5:30 AM.  Call this number if you have problems the morning of surgery: [phone]    Remember:   Do not eat food or drink liquids after midnight.   Take these medicines the morning of surgery with A SIP OF WATER: diltiazem (TIAZAC),gabapentin (NEURONTIN), metoprolol (TOPROL-XL).  Take if needed:  Hydrocodone- Acetaminophen if needed. Do not take medications for Diabetes the morning of surgery.  Stop taking Aspirin, Coumadin, Plavix, Effient and Herbal medications (vitamins , Fish Oil).  Do not take any NSAIDs ie: Ibuprofen,  Advil,Naproxen or any medication containing Aspirin.   Do not wear jewelry, make-up or nail polish.  Do not wear lotions, powders, or perfumes. You may wear deodorant.  Do not shave 48 hours prior to surgery. Men may shave face and neck.  Do not bring valuables to the hospital.  [facility] is not responsible for any belongings or valuables.               Contacts, dentures or bridgework may not be worn into surgery.  Leave suitcase in the car. After surgery it may be brought to your room.  For patients admitted to the hospital, discharge time is determined by your  treatment team.               Patients discharged the day of surgery will not be allowed to drive home.  Name and phone number of your driver: -     Please read over the following fact sheets that you were given: Pain Booklet, Coughing and Deep Breathing and Surgical Site Infection Prevention

## 2013-04-30 NOTE — Progress Notes (Signed)
04/30/13 1045  OBSTRUCTIVE SLEEP APNEA  Do you snore loudly (loud enough to be heard through closed doors)?  0  Do you often feel tired, fatigued, or sleepy during the daytime? 0  Has anyone observed you stop breathing during your sleep? 0  Do you have, or are you being treated for high blood pressure? 1  BMI more than 35 kg/m2? 1  Age over 71 years old? 1  Neck circumference greater than 40 cm/18 inches? 1 (19)  Gender: 0  Obstructive Sleep Apnea Score 4

## 2013-04-30 NOTE — Progress Notes (Signed)
Nathan Zimmerman reports having mid- sternum chest pains off and on.  States that the pain is sharp, does not radiate, does have some shortness of breath, denies diaphoresis, lightheadedness or dizziness.   Nathan Zimmerman rates pain less than a 5, sharp and "does not last too long".  Nathan Zimmerman said he has not reported this to Dr Diona Browner.  I notified Dr Maple Hudson of these findings.

## 2013-04-30 NOTE — Progress Notes (Signed)
[facility] - Preparing for Surgery  Before surgery, you can play an important role.  Because skin is not sterile, your skin needs to be as free of germs as possible.  You can reduce the number of germs on you skin by washing with CHG (chlorahexidine gluconate) soap before surgery.  CHG is an antiseptic cleaner which kills germs and bonds with the skin to continue killing germs even after washing.  Please DO NOT use if you have an allergy to CHG or antibacterial soaps.  If your skin becomes reddened/irritated stop using the CHG and inform your nurse when you arrive at Short Stay.  Do not shave (including legs and underarms) for at least 48 hours prior to the first CHG shower.  You may shave your face.  Please follow these instructions carefully:   1.  Shower with CHG Soap the night before surgery and the morning of Surgery.  2.  If you choose to wash your hair, wash your hair first as usual with your normal shampoo.  3.  After you shampoo, rinse your hair and body thoroughly to remove the  Shampoo.  4.  Use CHG as you would any other liquid soap.  You can apply chg directly to the skin and wash gently with scrungie or a clean washcloth.  5.  Apply the CHG Soap to your body ONLY FROM THE NECK DOWN.        Do not use on open wounds or open sores.  Avoid contact with your eyes,ears, mouth and genitals (private parts).  Wash genitals (private parts)with your normal soap.  6.  Wash thoroughly, paying special attention to the area where your surgery will be performed.  7.  Thoroughly rinse your body with warm water from the neck down.  8.  DO NOT shower/wash with your normal soap after using and rinsing off the CHG Soap.  9.  Pat yourself dry with a clean towel.            10.  Wear clean pajamas.            11.  Place clean sheets on your bed the night of your first shower and do not sleep with pets.  Day of Surgery  Do not apply any lotions/deodorants the morning of surgery.  Please wear clean  clothes to the hospital/surgery center. 

## 2013-05-01 ENCOUNTER — Encounter (HOSPITAL_COMMUNITY): Payer: Medicare Other | Admitting: Anesthesiology

## 2013-05-01 ENCOUNTER — Inpatient Hospital Stay (HOSPITAL_COMMUNITY): Payer: Medicare Other | Admitting: Anesthesiology

## 2013-05-01 ENCOUNTER — Inpatient Hospital Stay (HOSPITAL_COMMUNITY)
Admission: RE | Admit: 2013-05-01 | Discharge: 2013-05-04 | DRG: 988 | Disposition: A | Discharge status: Home / Self Care | Payer: Medicare Other | Source: Ambulatory Visit | Attending: Orthopaedic Surgery | Admitting: Orthopaedic Surgery

## 2013-05-01 ENCOUNTER — Encounter (HOSPITAL_COMMUNITY)
Admission: RE | Disposition: A | Discharge status: Home / Self Care | Payer: Self-pay | Source: Ambulatory Visit | Attending: Orthopaedic Surgery

## 2013-05-01 ENCOUNTER — Encounter (HOSPITAL_COMMUNITY): Payer: Self-pay | Admitting: Anesthesiology

## 2013-05-01 DIAGNOSIS — E1149 Type 2 diabetes mellitus with other diabetic neurological complication: Secondary | ICD-10-CM | POA: Diagnosis present

## 2013-05-01 DIAGNOSIS — I509 Heart failure, unspecified: Secondary | ICD-10-CM | POA: Diagnosis present

## 2013-05-01 DIAGNOSIS — E1142 Type 2 diabetes mellitus with diabetic polyneuropathy: Secondary | ICD-10-CM | POA: Diagnosis present

## 2013-05-01 DIAGNOSIS — I2510 Atherosclerotic heart disease of native coronary artery without angina pectoris: Secondary | ICD-10-CM | POA: Diagnosis present

## 2013-05-01 DIAGNOSIS — Z7901 Long term (current) use of anticoagulants: Secondary | ICD-10-CM | POA: Diagnosis not present

## 2013-05-01 DIAGNOSIS — J449 Chronic obstructive pulmonary disease, unspecified: Secondary | ICD-10-CM | POA: Diagnosis present

## 2013-05-01 DIAGNOSIS — I428 Other cardiomyopathies: Secondary | ICD-10-CM | POA: Diagnosis present

## 2013-05-01 DIAGNOSIS — I10 Essential (primary) hypertension: Secondary | ICD-10-CM | POA: Diagnosis present

## 2013-05-01 DIAGNOSIS — R29898 Other symptoms and signs involving the musculoskeletal system: Secondary | ICD-10-CM | POA: Diagnosis present

## 2013-05-01 DIAGNOSIS — K219 Gastro-esophageal reflux disease without esophagitis: Secondary | ICD-10-CM | POA: Diagnosis present

## 2013-05-01 DIAGNOSIS — Z87891 Personal history of nicotine dependence: Secondary | ICD-10-CM | POA: Diagnosis not present

## 2013-05-01 DIAGNOSIS — Z794 Long term (current) use of insulin: Secondary | ICD-10-CM | POA: Diagnosis not present

## 2013-05-01 DIAGNOSIS — I4891 Unspecified atrial fibrillation: Secondary | ICD-10-CM | POA: Diagnosis present

## 2013-05-01 DIAGNOSIS — L089 Local infection of the skin and subcutaneous tissue, unspecified: Secondary | ICD-10-CM

## 2013-05-01 DIAGNOSIS — L03039 Cellulitis of unspecified toe: Secondary | ICD-10-CM | POA: Diagnosis not present

## 2013-05-01 DIAGNOSIS — H9190 Unspecified hearing loss, unspecified ear: Secondary | ICD-10-CM | POA: Diagnosis present

## 2013-05-01 DIAGNOSIS — Z9981 Dependence on supplemental oxygen: Secondary | ICD-10-CM | POA: Diagnosis not present

## 2013-05-01 DIAGNOSIS — J4489 Other specified chronic obstructive pulmonary disease: Secondary | ICD-10-CM | POA: Diagnosis present

## 2013-05-01 DIAGNOSIS — I6990 Unspecified sequelae of unspecified cerebrovascular disease: Secondary | ICD-10-CM | POA: Diagnosis not present

## 2013-05-01 DIAGNOSIS — L02619 Cutaneous abscess of unspecified foot: Principal | ICD-10-CM | POA: Diagnosis present

## 2013-05-01 HISTORY — PX: I & D EXTREMITY: SHX5045

## 2013-05-01 LAB — GLUCOSE, CAPILLARY
GLUCOSE-CAPILLARY: 110 mg/dL — AB (ref 70–99)
GLUCOSE-CAPILLARY: 96 mg/dL (ref 70–99)
GLUCOSE-CAPILLARY: 151 mg/dL — AB (ref 70–99)
Glucose-Capillary: 129 mg/dL — ABNORMAL HIGH (ref 70–99)
Glucose-Capillary: 96 mg/dL (ref 70–99)

## 2013-05-01 LAB — PROTIME-INR
INR: 2.2 — AB (ref 0.00–1.49)
Prothrombin Time: 23.7 seconds — ABNORMAL HIGH (ref 11.6–15.2)

## 2013-05-01 SURGERY — IRRIGATION AND DEBRIDEMENT EXTREMITY
Anesthesia: Regional | Site: Toe | Laterality: Left

## 2013-05-01 MED ORDER — WARFARIN - PHYSICIAN DOSING INPATIENT: Freq: Every day | Status: DC

## 2013-05-01 MED ORDER — METOPROLOL SUCCINATE ER 100 MG PO TB24
200.0000 mg | ORAL_TABLET | Freq: Every day | ORAL | Status: DC
  Administered 2013-05-01 – 2013-05-04 (×4): 200 mg via ORAL
  Filled 2013-05-01 (×4): qty 2

## 2013-05-01 MED ORDER — SODIUM CHLORIDE 0.9 % IV SOLN: INTRAVENOUS | Status: DC | PRN

## 2013-05-01 MED ORDER — WARFARIN SODIUM 5 MG PO TABS: 5.0000 mg | ORAL_TABLET | Freq: Every day | ORAL | Status: DC

## 2013-05-01 MED ORDER — BACITRACIN ZINC 500 UNIT/GM EX OINT
TOPICAL_OINTMENT | CUTANEOUS | Status: DC | PRN
  Administered 2013-05-01: 1 via TOPICAL

## 2013-05-01 MED ORDER — ATORVASTATIN CALCIUM 40 MG PO TABS
40.0000 mg | ORAL_TABLET | Freq: Every day | ORAL | Status: DC
  Administered 2013-05-01 – 2013-05-03 (×3): 40 mg via ORAL
  Filled 2013-05-01 (×4): qty 1

## 2013-05-01 MED ORDER — VANCOMYCIN HCL 10 G IV SOLR
1250.0000 mg | Freq: Once | INTRAVENOUS | Status: AC
  Administered 2013-05-01: 1250 mg via INTRAVENOUS
  Filled 2013-05-01 (×2): qty 1250

## 2013-05-01 MED ORDER — LIDOCAINE-EPINEPHRINE (PF) 1.5 %-1:200000 IJ SOLN
INTRAMUSCULAR | Status: DC | PRN
  Administered 2013-05-01: 20 mL

## 2013-05-01 MED ORDER — METHOCARBAMOL 500 MG PO TABS: 500.0000 mg | ORAL_TABLET | Freq: Four times a day (QID) | ORAL | Status: DC | PRN

## 2013-05-01 MED ORDER — HYDROCODONE-ACETAMINOPHEN 5-325 MG PO TABS: 1.0000 | ORAL_TABLET | ORAL | Status: DC | PRN

## 2013-05-01 MED ORDER — SENNA 8.6 MG PO TABS
1.0000 | ORAL_TABLET | Freq: Two times a day (BID) | ORAL | Status: DC
Start: 2013-05-01 — End: 2013-05-04
  Administered 2013-05-01 – 2013-05-04 (×4): 8.6 mg via ORAL
  Filled 2013-05-01 (×7): qty 1

## 2013-05-01 MED ORDER — LIDOCAINE HCL (CARDIAC) 20 MG/ML IV SOLN
INTRAVENOUS | Status: AC
  Filled 2013-05-01: qty 5

## 2013-05-01 MED ORDER — POLYETHYLENE GLYCOL 3350 17 G PO PACK: 17.0000 g | PACK | Freq: Every day | ORAL | Status: DC | PRN

## 2013-05-01 MED ORDER — WARFARIN SODIUM 7.5 MG PO TABS
7.5000 mg | ORAL_TABLET | ORAL | Status: AC
  Administered 2013-05-02: 7.5 mg via ORAL
  Filled 2013-05-01: qty 1

## 2013-05-01 MED ORDER — ROCURONIUM BROMIDE 50 MG/5ML IV SOLN
INTRAVENOUS | Status: AC
  Filled 2013-05-01: qty 1

## 2013-05-01 MED ORDER — INSULIN ASPART 100 UNIT/ML ~~LOC~~ SOLN: 0.0000 [IU] | Freq: Every day | SUBCUTANEOUS | Status: DC

## 2013-05-01 MED ORDER — GLIMEPIRIDE 2 MG PO TABS
2.0000 mg | ORAL_TABLET | Freq: Every day | ORAL | Status: DC
  Administered 2013-05-02 – 2013-05-04 (×3): 2 mg via ORAL
  Filled 2013-05-01 (×4): qty 1

## 2013-05-01 MED ORDER — VANCOMYCIN HCL IN DEXTROSE 750-5 MG/150ML-% IV SOLN
750.0000 mg | Freq: Once | INTRAVENOUS | Status: AC
  Administered 2013-05-01: 750 mg via INTRAVENOUS
  Filled 2013-05-01: qty 150

## 2013-05-01 MED ORDER — BACITRACIN ZINC 500 UNIT/GM EX OINT
TOPICAL_OINTMENT | CUTANEOUS | Status: AC
  Filled 2013-05-01: qty 15

## 2013-05-01 MED ORDER — WARFARIN SODIUM 5 MG PO TABS
5.0000 mg | ORAL_TABLET | ORAL | Status: DC
  Administered 2013-05-01 – 2013-05-03 (×2): 5 mg via ORAL
  Filled 2013-05-01 (×3): qty 1

## 2013-05-01 MED ORDER — GABAPENTIN 600 MG PO TABS
600.0000 mg | ORAL_TABLET | Freq: Three times a day (TID) | ORAL | Status: DC
  Administered 2013-05-01 – 2013-05-03 (×8): 600 mg via ORAL
  Filled 2013-05-01 (×16): qty 1

## 2013-05-01 MED ORDER — METOCLOPRAMIDE HCL 10 MG PO TABS: 5.0000 mg | ORAL_TABLET | Freq: Three times a day (TID) | ORAL | Status: DC | PRN

## 2013-05-01 MED ORDER — ONDANSETRON HCL 4 MG PO TABS: 4.0000 mg | ORAL_TABLET | Freq: Four times a day (QID) | ORAL | Status: DC | PRN

## 2013-05-01 MED ORDER — SODIUM CHLORIDE 0.9 % IV SOLN: INTRAVENOUS | Status: DC

## 2013-05-01 MED ORDER — FENTANYL CITRATE 0.05 MG/ML IJ SOLN
INTRAMUSCULAR | Status: AC
  Filled 2013-05-01: qty 5

## 2013-05-01 MED ORDER — FUROSEMIDE 80 MG PO TABS
80.0000 mg | ORAL_TABLET | Freq: Two times a day (BID) | ORAL | Status: DC
  Administered 2013-05-01 – 2013-05-04 (×6): 80 mg via ORAL
  Filled 2013-05-01 (×8): qty 1

## 2013-05-01 MED ORDER — SODIUM CHLORIDE 0.9 % IV SOLN
INTRAVENOUS | Status: DC | PRN
  Administered 2013-05-01: 13:00:00 via INTRAVENOUS

## 2013-05-01 MED ORDER — METHOCARBAMOL 100 MG/ML IJ SOLN: 500.0000 mg | Freq: Four times a day (QID) | INTRAMUSCULAR | Status: DC | PRN

## 2013-05-01 MED ORDER — MORPHINE SULFATE 2 MG/ML IJ SOLN: 1.0000 mg | INTRAMUSCULAR | Status: DC | PRN

## 2013-05-01 MED ORDER — INSULIN ASPART 100 UNIT/ML ~~LOC~~ SOLN
0.0000 [IU] | Freq: Three times a day (TID) | SUBCUTANEOUS | Status: DC
  Administered 2013-05-01: 3 [IU] via SUBCUTANEOUS
  Administered 2013-05-02: 2 [IU] via SUBCUTANEOUS
  Administered 2013-05-02: 3 [IU] via SUBCUTANEOUS
  Administered 2013-05-03: 2 [IU] via SUBCUTANEOUS

## 2013-05-01 MED ORDER — ETOMIDATE 2 MG/ML IV SOLN
INTRAVENOUS | Status: AC
  Filled 2013-05-01: qty 10

## 2013-05-01 MED ORDER — POTASSIUM CHLORIDE CRYS ER 20 MEQ PO TBCR
20.0000 meq | EXTENDED_RELEASE_TABLET | Freq: Every day | ORAL | Status: DC
  Administered 2013-05-02 – 2013-05-04 (×3): 20 meq via ORAL
  Filled 2013-05-01 (×3): qty 1

## 2013-05-01 MED ORDER — GEMFIBROZIL 600 MG PO TABS
600.0000 mg | ORAL_TABLET | Freq: Every day | ORAL | Status: DC
  Administered 2013-05-01 – 2013-05-04 (×4): 600 mg via ORAL
  Filled 2013-05-01 (×4): qty 1

## 2013-05-01 MED ORDER — SODIUM CHLORIDE 0.9 % IV SOLN
INTRAVENOUS | Status: DC
  Administered 2013-05-01: 10 mL/h via INTRAVENOUS
  Administered 2013-05-02: 16:00:00 via INTRAVENOUS

## 2013-05-01 MED ORDER — MIDAZOLAM HCL 2 MG/2ML IJ SOLN
INTRAMUSCULAR | Status: AC
  Filled 2013-05-01: qty 2

## 2013-05-01 MED ORDER — ONDANSETRON HCL 4 MG/2ML IJ SOLN
4.0000 mg | Freq: Four times a day (QID) | INTRAMUSCULAR | Status: DC | PRN
  Administered 2013-05-02: 4 mg via INTRAVENOUS
  Filled 2013-05-01: qty 2

## 2013-05-01 MED ORDER — ONDANSETRON HCL 4 MG/2ML IJ SOLN
INTRAMUSCULAR | Status: AC
  Filled 2013-05-01: qty 2

## 2013-05-01 MED ORDER — BENAZEPRIL HCL 20 MG PO TABS
20.0000 mg | ORAL_TABLET | Freq: Every day | ORAL | Status: DC
  Administered 2013-05-01 – 2013-05-04 (×4): 20 mg via ORAL
  Filled 2013-05-01 (×4): qty 1

## 2013-05-01 MED ORDER — SORBITOL 70 % SOLN: 30.0000 mL | Freq: Every day | Status: DC | PRN

## 2013-05-01 MED ORDER — HYDROCODONE-ACETAMINOPHEN 10-325 MG PO TABS
1.0000 | ORAL_TABLET | Freq: Three times a day (TID) | ORAL | Status: DC | PRN
  Administered 2013-05-02 – 2013-05-04 (×5): 1 via ORAL
  Filled 2013-05-01 (×5): qty 1

## 2013-05-01 MED ORDER — DIPHENHYDRAMINE HCL 12.5 MG/5ML PO ELIX: 25.0000 mg | ORAL_SOLUTION | ORAL | Status: DC | PRN

## 2013-05-01 MED ORDER — SODIUM CHLORIDE 0.9 % IR SOLN
Status: DC | PRN
  Administered 2013-05-01: 3000 mL
  Administered 2013-05-01: 1000 mL

## 2013-05-01 MED ORDER — SILVER SULFADIAZINE 1 % EX CREA
TOPICAL_CREAM | Freq: Once | CUTANEOUS | Status: AC
  Administered 2013-05-01: 1 via TOPICAL
  Filled 2013-05-01: qty 85

## 2013-05-01 MED ORDER — DILTIAZEM HCL ER BEADS 240 MG PO CP24
240.0000 mg | ORAL_CAPSULE | Freq: Every day | ORAL | Status: DC
  Administered 2013-05-02 – 2013-05-04 (×3): 240 mg via ORAL
  Filled 2013-05-01 (×3): qty 1

## 2013-05-01 MED ORDER — METOCLOPRAMIDE HCL 5 MG/ML IJ SOLN: 5.0000 mg | Freq: Three times a day (TID) | INTRAMUSCULAR | Status: DC | PRN

## 2013-05-01 MED ORDER — MAGNESIUM CITRATE PO SOLN: 1.0000 | Freq: Once | ORAL | Status: AC | PRN

## 2013-05-01 MED ORDER — ONDANSETRON HCL 4 MG/2ML IJ SOLN
INTRAMUSCULAR | Status: DC | PRN
  Administered 2013-05-01: 4 mg via INTRAVENOUS

## 2013-05-01 MED ORDER — SODIUM CHLORIDE 0.9 % IV SOLN
1750.0000 mg | INTRAVENOUS | Status: DC
Start: 2013-05-02 — End: 2013-05-02
  Filled 2013-05-01: qty 1750

## 2013-05-01 MED ORDER — OXYCODONE HCL 5 MG PO TABS: 5.0000 mg | ORAL_TABLET | ORAL | Status: DC | PRN

## 2013-05-01 SURGICAL SUPPLY — 58 items
BANDAGE COBAN STERILE 2 (GAUZE/BANDAGES/DRESSINGS) ×2 IMPLANT
BANDAGE CONFORM 2  STR LF (GAUZE/BANDAGES/DRESSINGS) ×2 IMPLANT
BANDAGE CONFORM 3  STR LF (GAUZE/BANDAGES/DRESSINGS) ×2 IMPLANT
BANDAGE ELASTIC 3 VELCRO ST LF (GAUZE/BANDAGES/DRESSINGS) IMPLANT
BLADE SURG 10 STRL SS (BLADE) ×3 IMPLANT
BNDG COHESIVE 1X5 TAN STRL LF (GAUZE/BANDAGES/DRESSINGS) IMPLANT
BNDG COHESIVE 4X5 TAN STRL (GAUZE/BANDAGES/DRESSINGS) ×1 IMPLANT
BNDG COHESIVE 6X5 TAN STRL LF (GAUZE/BANDAGES/DRESSINGS) ×2 IMPLANT
BNDG GAUZE STRTCH 6 (GAUZE/BANDAGES/DRESSINGS) ×3 IMPLANT
CLOTH BEACON ORANGE TIMEOUT ST (SAFETY) ×3 IMPLANT
CORDS BIPOLAR (ELECTRODE) IMPLANT
COVER SURGICAL LIGHT HANDLE (MISCELLANEOUS) ×3 IMPLANT
CUFF TOURNIQUET SINGLE 18IN (TOURNIQUET CUFF) ×1 IMPLANT
CUFF TOURNIQUET SINGLE 24IN (TOURNIQUET CUFF) IMPLANT
CUFF TOURNIQUET SINGLE 34IN LL (TOURNIQUET CUFF) ×2 IMPLANT
CUFF TOURNIQUET SINGLE 44IN (TOURNIQUET CUFF) IMPLANT
DRAPE ORTHO SPLIT 77X108 STRL (DRAPES) ×6
DRAPE SURG 17X23 STRL (DRAPES) IMPLANT
DRAPE SURG ORHT 6 SPLT 77X108 (DRAPES) ×2 IMPLANT
DRAPE U-SHAPE 47X51 STRL (DRAPES) ×3 IMPLANT
DURAPREP 26ML APPLICATOR (WOUND CARE) ×3 IMPLANT
ELECT CAUTERY BLADE 6.4 (BLADE) ×3 IMPLANT
ELECT REM PT RETURN 9FT ADLT (ELECTROSURGICAL)
ELECTRODE REM PT RTRN 9FT ADLT (ELECTROSURGICAL) IMPLANT
FACESHIELD LNG OPTICON STERILE (SAFETY) IMPLANT
GAUZE XEROFORM 1X8 LF (GAUZE/BANDAGES/DRESSINGS) ×3 IMPLANT
GLOVE SURG SS PI 7.5 STRL IVOR (GLOVE) ×6 IMPLANT
GOWN STRL NON-REIN LRG LVL3 (GOWN DISPOSABLE) ×3 IMPLANT
GOWN STRL REIN XL XLG (GOWN DISPOSABLE) ×3 IMPLANT
HANDPIECE INTERPULSE COAX TIP (DISPOSABLE)
KIT BASIN OR (CUSTOM PROCEDURE TRAY) ×3 IMPLANT
KIT ROOM TURNOVER OR (KITS) ×3 IMPLANT
MANIFOLD NEPTUNE II (INSTRUMENTS) ×3 IMPLANT
NS IRRIG 1000ML POUR BTL (IV SOLUTION) ×3 IMPLANT
PACK ORTHO EXTREMITY (CUSTOM PROCEDURE TRAY) ×3 IMPLANT
PAD ARMBOARD 7.5X6 YLW CONV (MISCELLANEOUS) ×6 IMPLANT
PADDING CAST ABS 4INX4YD NS (CAST SUPPLIES)
PADDING CAST ABS COTTON 4X4 ST (CAST SUPPLIES) ×2 IMPLANT
PADDING CAST COTTON 6X4 STRL (CAST SUPPLIES) ×1 IMPLANT
SET HNDPC FAN SPRY TIP SCT (DISPOSABLE) IMPLANT
SPONGE GAUZE 4X4 12PLY (GAUZE/BANDAGES/DRESSINGS) ×3 IMPLANT
SPONGE GAUZE 4X4 12PLY STER LF (GAUZE/BANDAGES/DRESSINGS) ×2 IMPLANT
SPONGE LAP 18X18 X RAY DECT (DISPOSABLE) ×3 IMPLANT
STOCKINETTE IMPERVIOUS 9X36 MD (GAUZE/BANDAGES/DRESSINGS) ×3 IMPLANT
SUT ETHILON 2 0 FS 18 (SUTURE) ×3 IMPLANT
SUT ETHILON 3 0 PS 1 (SUTURE) ×4 IMPLANT
SUT VIC AB 2-0 FS1 27 (SUTURE) ×1 IMPLANT
SYR CONTROL 10ML LL (SYRINGE) IMPLANT
TOWEL OR 17X24 6PK STRL BLUE (TOWEL DISPOSABLE) ×3 IMPLANT
TOWEL OR 17X26 10 PK STRL BLUE (TOWEL DISPOSABLE) ×3 IMPLANT
TUBE ANAEROBIC SPECIMEN COL (MISCELLANEOUS) ×2 IMPLANT
TUBE CONNECTING 12'X1/4 (SUCTIONS) ×1
TUBE CONNECTING 12X1/4 (SUCTIONS) ×2 IMPLANT
TUBE FEEDING 5FR 15 INCH (TUBING) IMPLANT
TUBING CYSTO DISP (UROLOGICAL SUPPLIES) ×3 IMPLANT
UNDERPAD 30X30 INCONTINENT (UNDERPADS AND DIAPERS) ×3 IMPLANT
WATER STERILE IRR 1000ML POUR (IV SOLUTION) ×1 IMPLANT
YANKAUER SUCT BULB TIP NO VENT (SUCTIONS) ×3 IMPLANT

## 2013-05-01 NOTE — Brief Op Note (Signed)
   Brief Op Note  Date of Surgery: 05/01/2013  Preoperative Diagnosis: Left great toe infection  Postoperative Diagnosis: same  Procedure: Procedure(s): IRRIGATION AND DEBRIDEMENT LEFT GREAT TOE  Implants: none  Surgeons: Surgeon(s): Abriel Geesey Glee Arvin, MD  Anesthesia: General  Drains: none  Estimated Blood Loss: See anesthesia record  Complications: None  Condition to PACU: Stable  Lil Lepage Glee Arvin, MD [facility] 05/01/2013 12:48 PM

## 2013-05-01 NOTE — Progress Notes (Signed)
Orthopedic Tech Progress Note Patient Details:  Nathan Zimmerman [birth year] [id number]  Patient ID: Nathan Zimmerman, male   DOB: 1940/06/04, 73 y.o.   MRN: [id number]   Nathan Zimmerman 05/01/2013, 3:04 PMTrapeze bar

## 2013-05-01 NOTE — Evaluation (Signed)
Physical Therapy Evaluation Patient Details Name: Nathan Zimmerman MRN: [id number] DOB: 05-11-1940 Today's Date: 05/01/2013 Time: [id number] PT Time Calculation (min): 16 min  PT Assessment / Plan / Recommendation History of Present Illness  Pt is a 73 y/o male admitted s/p I&D of L great toe infection. Pt fell and had a laceration to his toe 2 weeks ago, and failed oral antibiotics.   Clinical Impression  This patient presents with acute pain and decreased functional independence following the above mentioned procedure. At the time of PT eval, pt was able to perform bed mobility with supervision and transfers with min guard, however pt unable to attempt NWB status on the L. Spoke with RN regarding possibly getting darco shoe or something to off-weight forefoot/toes. This patient is appropriate for skilled PT interventions to address functional limitations, improve safety and independence with functional mobility, and return to PLOF.     PT Assessment  Patient needs continued PT services    Follow Up Recommendations  Home health PT    Does the patient have the potential to tolerate intense rehabilitation      Barriers to Discharge        Equipment Recommendations  Wheelchair (measurements PT);Wheelchair cushion (measurements PT);Other (comment) (W/C if pt cannot maintain WB precautions.)    Recommendations for Other Services     Frequency Min 3X/week    Precautions / Restrictions Precautions Precautions: Fall Restrictions Weight Bearing Restrictions: Yes LLE Weight Bearing: Non weight bearing   Pertinent Vitals/Pain Pt reports no pain throughout session.      Mobility  Bed Mobility Overal bed mobility: Needs Assistance Bed Mobility: Supine to Sit Supine to sit: Supervision General bed mobility comments: Pt was able to transition to EOB with HOB slightly elevated and supervision. No cues or physical assist required.  Transfers Overall transfer level: Needs  assistance Equipment used: Rolling walker (2 wheeled) Transfers: Sit to/from [employer] CorporationStand;Stand Pivot Transfers Sit to Stand: Min guard Stand pivot transfers: Min assist General transfer comment: Pt able to transition to full stand without physical assist. Pt cued for NWB status on L, but unable to maintain. Pt unable to lift L foot off floor at all to maintain NWB status during static standing. Min assist to pivot and return to sitting safely.  Ambulation/Gait General Gait Details: NT due to inability to maintain NWB status.     Exercises General Exercises - Lower Extremity Ankle Circles/Pumps: 10 reps   PT Diagnosis: Difficulty walking  PT Problem List: Decreased strength;Decreased range of motion;Decreased activity tolerance;Decreased balance;Decreased mobility;Decreased knowledge of use of DME;Decreased safety awareness;Decreased knowledge of precautions PT Treatment Interventions: DME instruction;Gait training;Functional mobility training;Therapeutic activities;Therapeutic exercise;Neuromuscular re-education;Patient/family education     PT Goals(Current goals can be found in the care plan section) Acute Rehab PT Goals Patient Stated Goal: None stated  PT Goal Formulation: With patient Time For Goal Achievement: 05/08/13 Potential to Achieve Goals: Good  Visit Information  Last PT Received On: 05/01/13 Assistance Needed: +1 History of Present Illness: Pt is a 73 y/o male admitted s/p I&D of L great toe infection. Pt fell and had a laceration to his toe 2 weeks ago, and failed oral antibiotics.        Prior Functioning  Home Living Family/patient expects to be discharged to:: Private residence Living Arrangements: Spouse/significant other Available Help at Discharge: Family;Available 24 hours/day Type of Home: House Home Access: Ramped entrance Home Layout: One level Home Equipment: Walker - 2 wheels;Cane - single point Additional Comments: Unclear if pt  bathes sitting down in  shower or sponge bathes. Prior Function Level of Independence: Needs assistance Gait / Transfers Assistance Needed: used walker? unclear ADL's / Homemaking Assistance Needed: Wife assisted with bathing and dressing Communication Communication: HOH Dominant Hand: Right    Cognition  Cognition Arousal/Alertness: Lethargic;Suspect due to medications Behavior During Therapy: [facility] for tasks assessed/performed Overall Cognitive Status: Within Functional Limits for tasks assessed    Extremity/Trunk Assessment Upper Extremity Assessment Upper Extremity Assessment: Defer to OT evaluation Lower Extremity Assessment Lower Extremity Assessment: LLE deficits/detail LLE Deficits / Details: Pt has no sensation in plantar side of foot (toes-heel). Decreased sensation in dorsum of foot. Cervical / Trunk Assessment Cervical / Trunk Assessment: Normal   Balance Balance Overall balance assessment: Needs assistance;History of Falls Sitting-balance support: Feet supported;Bilateral upper extremity supported Sitting balance-Leahy Scale: Good Standing balance support: Bilateral upper extremity supported Standing balance-Leahy Scale: Fair  End of Session PT - End of Session Equipment Utilized During Treatment: Gait belt;Oxygen Activity Tolerance: Patient tolerated treatment well Patient left: in chair;with call bell/phone within reach Nurse Communication: Mobility status;Weight bearing status;Other (comment) (Need for darco shoe or something to off-weight forefoot/toes)  GP     Ruthann Cancer 05/01/2013, 3:30 PM  Ruthann Cancer, PT, DPT [phone]

## 2013-05-01 NOTE — Progress Notes (Signed)
ANTIBIOTIC CONSULT NOTE - INITIAL  Pharmacy Consult for vancomycin Indication: left great toe infection  No Known Allergies  Patient Measurements: Height: 6\' 2"  (188 cm) Weight: 304 lb 14.3 oz (138.3 kg) IBW/kg (Calculated) : 82.2  Vital Signs: Temp: 97.4 F (36.3 C) (02/13 1401) Temp src: Oral (02/13 1401) BP: 133/94 mmHg (02/13 1401) Pulse Rate: 80 (02/13 1401) Intake/Output from previous day:   Intake/Output from this shift: Total I/O In: 100 [I.V.:100] Out: -   Labs:  Recent Labs  04/30/13 1131  WBC 6.3  HGB 14.8  PLT 236  CREATININE 1.92*   Estimated Creatinine Clearance: 53 ml/min (by C-G formula based on Cr of 1.92). No results found for this basename: VANCOTROUGH, Leodis Binet, VANCORANDOM, GENTTROUGH, GENTPEAK, GENTRANDOM, TOBRATROUGH, TOBRAPEAK, TOBRARND, AMIKACINPEAK, AMIKACINTROU, AMIKACIN,  in the last 72 hours   Microbiology: Recent Results (from the past 720 hour(s))  SURGICAL PCR SCREEN     Status: None   Collection Time    04/30/13 11:20 AM      Result Value Ref Range Status   MRSA, PCR NEGATIVE  NEGATIVE Final   Staphylococcus aureus NEGATIVE  NEGATIVE Final   Comment:            The Xpert SA Assay (FDA     approved for NASAL specimens     in patients over 71 years of age),     is one component of     a comprehensive surveillance     program.  Test performance has     been validated by [employer] for patients greater     than or equal to 71 year old.     It is not intended     to diagnose infection nor to     guide or monitor treatment.    Medical History: Past Medical History  Diagnosis Date  . Atrial fibrillation   . COPD (chronic obstructive pulmonary disease)   . Diabetes mellitus, type 2   . 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 Arthritis   . Renal insufficiency   . Hypertension   . Dysrhythmia     Afib  . CHF (congestive heart failure)   . Shortness of breath   . 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  . On home oxygen therapy     at night  . GERD (gastroesophageal reflux disease)   . Constipation   . Neuropathy associated with endocrine disorder   . HOH (hard of hearing)     Assessment: 73 YOM who is s/p I&D of left great toe for infection/cellulitis s/p lacteration and failing PO antibiotics. No mention of osteomyelitis. He received vancomycin 1250mg  IV x1 today at 1330 pre-op. SCr 1.92, CrCL ~40mL/min. Wound culture sent.  Goal of Therapy:  Vancomycin trough level 10-15 mcg/ml  Plan:  1. Give another vancomycin 750mg  IV x1 to complete a loading dose of 2500mg  2. Vancomycin 1750mg  IV q24h 3. Follow c/s, clinical progression, LOT, renal function, trough at [facility]  Lillias Difrancesco D. Kieryn Burtis, PharmD, BCPS Clinical Pharmacist Pager: [phone] 05/01/2013 2:12 PM

## 2013-05-01 NOTE — Op Note (Signed)
Date of surgery: 05/01/2048  Preoperative diagnosis: Left great toe infection and cellulitis  Postoperative diagnosis: Same  Procedure: Irrigation and debridement of bone, muscle, subcutaneous tissue, skin.  Surgeon: Glee Arvin, M.D.  Anesthesia: Ankle block  Estimated blood loss: Immobile  Complications: None  Indication for procedure: Nathan Zimmerman is a 73 year old gentleman who presents today for surgical treatment of a left great toe infection. He sustained a laceration to this toe proximally 2 weeks ago and failed oral antibiotics. The risks, benefits, and alternatives to surgery were again discussed with the patient and he wished to proceed.  Description of procedure: Nathan Zimmerman was identified in the preoperative holding area. He operative site was marked and confirmed by the patient and signed by the surgeon. An ankle block was administered by the anesthesiologist. He is brought back to the operating room. He was given light sedation in the operating room. The left lower extremity was prepped and draped in standard sterile fashion. Preoperative antibiotics were held in anticipation for cultures. I opened up the original laceration. Blunt dissection was taken down to the level of the bone. The there was not any frank pus. 2 wound cultures were taken. Sharp excisional debridement of bone, muscle, subcutaneous tissue, skin was performed with a knife. Once the unhealthy tissues were excised 3 L of normal saline was irrigated through the wound using cystoscopy tubing. The wound was closed loosely with 3-0 nylon sutures. A sterile dressing was applied. The patient tolerated the procedure well.  Disposition: The patient will be admitted to the orthopedic floor. He will remain on IV antibiotics per pharmacy dosing. We will obtain a cardiology consult for patient report of intermittent substernal chest pain over the last month. We will follow the cultures that were taken in tailor the antibiotics  accordingly.  Mayra Reel, MD [facility] [phone] 1:17 PM

## 2013-05-01 NOTE — Transfer of Care (Signed)
Immediate Anesthesia Transfer of Care Note  Patient: Nathan Zimmerman  Procedure(s) Performed: Procedure(s): IRRIGATION AND DEBRIDEMENT LEFT GREAT TOE (Left)  Patient Location: PACU  Anesthesia Type:MAC  Level of Consciousness: awake, alert , oriented and patient cooperative  Airway & Oxygen Therapy: Patient Spontanous Breathing and Patient connected to nasal cannula oxygen  Post-op Assessment: Report given to PACU RN and Post -op Vital signs reviewed and stable  Post vital signs: Reviewed  Complications: No apparent anesthesia complications

## 2013-05-01 NOTE — Anesthesia Procedure Notes (Signed)
Anesthesia Regional Block:  Ankle block  Pre-Anesthetic Checklist: ,, timeout performed, Correct Patient, Correct Site, Correct Laterality, Correct Procedure,, site marked, risks and benefits discussed,, at surgeon's request  Laterality: Left and Lower  Prep: chloraprep       Needles:   Needle Type: Other     Needle Length: 2.5cm  Needle Gauge: 25 and 25 G    Additional Needles:  Procedures: other Ankle block Narrative:  Start time: 05/01/2013 10:25 AM End time: 05/01/2013 10:40 AM Injection made incrementally with aspirations every 5 mL.  Performed by: Personally  Anesthesiologist: TMassagee  Additional Notes: Ankle Block performed.BP cuff, EKG monitor applied. Peri-ankle infiltration performed. Lidocaine 1% c Epi

## 2013-05-01 NOTE — H&P (Signed)
PREOPERATIVE H&P  Chief Complaint: Left great toe infection  HPI: Nathan Zimmerman is a 73 y.o. male who presents for surgical treatment of Left great toe infection.  He c/o intermittent substernal chest pain for the last month.  Has not seen a cardiologist recently.  Past Medical History  Diagnosis Date  . Atrial fibrillation   . COPD (chronic obstructive pulmonary disease)   . Diabetes mellitus, type 2   . 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 Arthritis   . Renal insufficiency   . Hypertension   . Dysrhythmia     Afib  . CHF (congestive heart failure)   . Shortness of breath   . 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  . On home oxygen therapy     at night  . GERD (gastroesophageal reflux disease)   . Constipation   . Neuropathy associated with endocrine disorder   . HOH (hard of hearing)    Past Surgical History  Procedure Laterality Date  . Femoral hernia repair Right    History   Social History  . Marital Status: Married    Spouse Name: N/A    Number of Children: N/A  . Years of Education: N/A   Occupational History  . Retired [employer]il [employer]    Social History Main Topics  . Smoking status: Former Smoker -- 2.00 packs/day for 60 years    Types: Cigarettes    Quit date: 06/17/2008  . Smokeless tobacco: Current User    Types: Chew     Comment: chew one pack tobacco per day for most of life off and on  . Alcohol Use: No  . Drug Use: No  . Sexual Activity: None   Other Topics Concern  . None   Social History Narrative   [place] in [place]   History reviewed. No pertinent family history. No Known Allergies Prior to Admission medications   Medication Sig Start Date End Date Taking? Authorizing Provider  atorvastatin (LIPITOR) 40 MG tablet Take 40 mg by mouth daily.   Yes Historical Provider, MD  benazepril (LOTENSIN) 20 MG tablet Take 20 tablets by mouth Daily. 04/25/10  Yes Historical Provider, MD   diltiazem (TIAZAC) 240 MG 24 hr capsule Take 240 mg by mouth daily.   Yes Historical Provider, MD  doxycycline (VIBRAMYCIN) 100 MG capsule Take 1 capsule (100 mg total) by mouth 2 (two) times daily. 04/18/13  Yes Sunnie NielsenBrian Opitz, MD  furosemide (LASIX) 80 MG tablet Take 80 mg by mouth 2 (two) times daily.   Yes Historical Provider, MD  gabapentin (NEURONTIN) 600 MG tablet Take 600 mg by mouth 3 (three) times daily.   Yes Historical Provider, MD  gemfibrozil (LOPID) 600 MG tablet Take 600 tablets by mouth daily.  03/18/10  Yes Historical Provider, MD  glimepiride (AMARYL) 2 MG tablet Take 2 mg by mouth Daily.  05/12/10  Yes Historical Provider, MD  HYDROcodone-acetaminophen (NORCO) 10-325 MG per tablet Take 1 tablet by mouth every 8 (eight) hours as needed for moderate pain.    Yes Historical Provider, MD  insulin NPH-regular Human (NOVOLIN 70/30) (70-30) 100 UNIT/ML injection Inject 28 Units into the skin 2 (two) times daily with a meal.    Yes Historical Provider, MD  metFORMIN (GLUCOPHAGE) 500 MG tablet Take 500 mg by mouth Twice daily.  03/18/10  Yes Historical Provider, MD  metoprolol (TOPROL-XL) 200 MG 24 hr tablet Take 1 tablet (200 mg total)  by mouth daily. 11/18/12  Yes Jonelle Sidle, MD  Omega-3 Fatty Acids (FISH OIL) 1000 MG CAPS Take 1 capsule by mouth 3 (three) times daily.    Yes Historical Provider, MD  potassium chloride SA (K-DUR,KLOR-CON) 20 MEQ tablet Take 20 mEq by mouth daily.   Yes Historical Provider, MD  sulfamethoxazole-trimethoprim (BACTRIM DS) 800-160 MG per tablet Take 1 tablet by mouth 2 (two) times daily.   Yes Historical Provider, MD  Tetrahydrozoline HCl (EYE DROPS OP) Place 2 drops into both eyes daily as needed (dry eyes).   Yes Historical Provider, MD  warfarin (COUMADIN) 5 MG tablet Take 5-7.5 mg by mouth daily. 1 tablet (5mg ) on Sunday, Monday, Wednesday, Friday. 2 1/2 tablet (7.5mg ) on Tuesday, Thursday and Saturday.   Yes Historical Provider, MD     Positive ROS:  All other systems have been reviewed and were otherwise negative with the exception of those mentioned in the HPI and as above.  Physical Exam: General: Alert, no acute distress Cardiovascular: No pedal edema Respiratory: No cyanosis, no use of accessory musculature GI: No organomegaly, abdomen is soft and non-tender Skin: No lesions in the area of chief complaint Neurologic: Sensation intact distally Psychiatric: Patient is competent for consent with normal mood and affect Lymphatic: No axillary or cervical lymphadenopathy  MUSCULOSKELETAL:  Left great toe - cellulitis, swelling, drainage from laceration - wwp - peripheral neuropathy  Assessment: Left great toe infection  Plan: Plan for Procedure(s): IRRIGATION AND DEBRIDEMENT LEFT GREAT TOE  The risks benefits and alternatives were discussed with the patient including but not limited to the risks of nonoperative treatment, versus surgical intervention including infection, bleeding, nerve injury,  blood clots, cardiopulmonary complications, morbidity, mortality, among others, and they were willing to proceed.   Discussed with patient that with his peripheral neuropathy and reports of chest pain, surgery under ankle block is desired and we would obtain a cardiology consult postop.    Cheral Almas, MD   05/01/2013 1:18 PM

## 2013-05-01 NOTE — Anesthesia Postprocedure Evaluation (Signed)
Anesthesia Post Note  Patient: Nathan Zimmerman  Procedure(s) Performed: Procedure(s) (LRB): IRRIGATION AND DEBRIDEMENT LEFT GREAT TOE (Left)  Anesthesia type: MAC  Patient location: PACU  Post pain: Pain level controlled  Post assessment: Patient's Cardiovascular Status Stable  Last Vitals:  Filed Vitals:   05/01/13 1307  BP: 130/80  Pulse: 70  Temp:   Resp: 11    Post vital signs: Reviewed and stable  Level of consciousness: sedated  Complications: No apparent anesthesia complications

## 2013-05-01 NOTE — Preoperative (Signed)
Beta Blockers   Reason not to administer Beta Blockers:Not Applicable 

## 2013-05-01 NOTE — Progress Notes (Signed)
IV vanc per pharmacy dosing Following cultures Cardiology consult obtained for chest pain - will see this weekend Patient stable Plan to keep over the weekend and d/c home Monday at the earliest.

## 2013-05-01 NOTE — Anesthesia Preprocedure Evaluation (Addendum)
Anesthesia Evaluation  Patient identified by MRN, date of birth, ID band Patient awake    Reviewed: Allergy & Precautions, H&P , NPO status , Patient's Chart, lab work & pertinent test results, reviewed documented beta blocker date and time   History of Anesthesia Complications Negative for: history of anesthetic complications  Airway Mallampati: III TM Distance: >3 FB Neck ROM: Full    Dental  (+) Edentulous Upper, Edentulous Lower, Dental Advisory Given   Pulmonary shortness of breath, with exertion and Long-Term Oxygen Therapy, COPD oxygen dependent, former smoker,  + rhonchi   + decreased breath sounds      Cardiovascular hypertension, Pt. on medications and Pt. on home beta blockers + CAD and +CHF + dysrhythmias Atrial Fibrillation Rhythm:Regular Rate:Normal     Neuro/Psych CVA, Residual Symptoms    GI/Hepatic GERD-  Controlled,  Endo/Other  diabetes, Type 2, Oral Hypoglycemic Agents, Insulin DependentMorbid obesity  Renal/GU Renal InsufficiencyRenal disease     Musculoskeletal   Abdominal   Peds  Hematology   Anesthesia Other Findings   Reproductive/Obstetrics                         Anesthesia Physical Anesthesia Plan  ASA: IV  Anesthesia Plan: Regional   Post-op Pain Management:    Induction:   Airway Management Planned: Simple Face Mask  Additional Equipment:   Intra-op Plan:   Post-operative Plan:   Informed Consent:   Plan Discussed with: CRNA and Surgeon  Anesthesia Plan Comments:         Anesthesia Quick Evaluation

## 2013-05-02 LAB — COMPREHENSIVE METABOLIC PANEL
ALBUMIN: 3.7 g/dL (ref 3.5–5.2)
ALK PHOS: 90 U/L (ref 39–117)
ALT: 13 U/L (ref 0–53)
AST: 20 U/L (ref 0–37)
BILIRUBIN TOTAL: 0.7 mg/dL (ref 0.3–1.2)
BUN: 21 mg/dL (ref 6–23)
CHLORIDE: 102 meq/L (ref 96–112)
CO2: 24 mEq/L (ref 19–32)
Calcium: 9.1 mg/dL (ref 8.4–10.5)
Creatinine, Ser: 1.42 mg/dL — ABNORMAL HIGH (ref 0.50–1.35)
GFR calc Af Amer: 56 mL/min — ABNORMAL LOW (ref >90)
GFR calc non Af Amer: 49 mL/min — ABNORMAL LOW (ref >90)
Glucose, Bld: 126 mg/dL — ABNORMAL HIGH (ref 70–99)
POTASSIUM: 3.7 meq/L (ref 3.7–5.3)
Sodium: 143 mEq/L (ref 137–147)
Total Protein: 7.7 g/dL (ref 6.0–8.3)

## 2013-05-02 LAB — GLUCOSE, CAPILLARY
GLUCOSE-CAPILLARY: 126 mg/dL — AB (ref 70–99)
Glucose-Capillary: 118 mg/dL — ABNORMAL HIGH (ref 70–99)
Glucose-Capillary: 154 mg/dL — ABNORMAL HIGH (ref 70–99)
Glucose-Capillary: 120 mg/dL — ABNORMAL HIGH (ref 70–99)

## 2013-05-02 MED ORDER — VANCOMYCIN HCL 10 G IV SOLR
1250.0000 mg | Freq: Two times a day (BID) | INTRAVENOUS | Status: DC
  Administered 2013-05-02 – 2013-05-04 (×4): 1250 mg via INTRAVENOUS
  Filled 2013-05-02 (×5): qty 1250

## 2013-05-02 NOTE — Evaluation (Addendum)
Occupational Therapy Evaluation Patient Details Name: Nathan Zimmerman MRN: [id number] DOB: 1940/10/03 Today's Date: 05/02/2013 Time: 3419-3790 (OT/PT time overlapped approx. 3 minutes- with PT from 1130-1133) OT Time Calculation (min): 21 min  OT Assessment / Plan / Recommendation History of present illness Pt is a 73 y/o male admitted s/p I&D of L great toe infection. Pt fell and had a laceration to his toe 2 weeks ago, and failed oral antibiotics. He is now NWB L foot and has h/o stroke with left sided weakness.     Clinical Impression   Pt presents with below problem list. Feel pt will benefit from acute OT to increase independence, safety, and strength prior to d/c. Recommending SNF for additional rehab prior to d/c home with wife.     OT Assessment  Patient needs continued OT Services    Follow Up Recommendations  SNF    Barriers to Discharge      Equipment Recommendations  Other (comment) (defer to next venue)    Recommendations for Other Services    Frequency  Min 2X/week    Precautions / Restrictions Precautions Precautions: Fall Precaution Comments: left sided weakness at baseline, unable to maintain NWB left foot.  Required Braces or Orthoses: Other Brace/Splint Other Brace/Splint: PT suggested DARCO shoe (fx shoe in room ) to help protect left foot since pt is supposed to be NWB.   Restrictions Weight Bearing Restrictions: Yes LLE Weight Bearing: Non weight bearing   Pertinent Vitals/Pain C/o buttocks hurting. Educated on pressure relief technique.    ADL  Eating/Feeding: Independent Where Assessed - Eating/Feeding: Chair Grooming: Set up Where Assessed - Grooming: Supported sitting Upper Body Bathing: Set up;Supervision/safety Where Assessed - Upper Body Bathing: Supported sitting Lower Body Bathing: +2 Total assistance;Maximal assistance Where Assessed - Lower Body Bathing: Supported sit to stand Upper Body Dressing: Minimal assistance Where Assessed -  Upper Body Dressing: Supported sitting Lower Body Dressing: +2 Total assistance;+1 Total assistance Where Assessed - Lower Body Dressing: Supported sit to Pharmacist, hospital: +2 Total assistance;Maximal assistance; Min A; Mod A Toilet Transfer Method: Sit to stand;Stand pivot Acupuncturist: Extra wide bedside commode Toileting - Clothing Manipulation and Hygiene: Maximal assistance;Moderate assistance Where Assessed - Toileting Clothing Manipulation and Hygiene: Lean right and/or left;Sit on 3-in-1 or toilet Tub/Shower Transfer Method: Not assessed Equipment Used: Gait belt;Reacher;Rolling walker;Sock aid;Long-handled sponge Transfers/Ambulation Related to ADLs: +2 Total A/Max A ADL Comments: Recommended sitting for bathing and dressing. Showed pt and wife AE to assist with LB ADLs.     OT Diagnosis: Acute pain;Generalized weakness  OT Problem List: Decreased strength;Decreased activity tolerance;Impaired balance (sitting and/or standing);Decreased knowledge of use of DME or AE;Decreased knowledge of precautions;Pain;Increased edema OT Treatment Interventions: Self-care/ADL training;Therapeutic exercise;DME and/or AE instruction;Therapeutic activities;Patient/family education;Balance training   OT Goals(Current goals can be found in the care plan section) Acute Rehab OT Goals Patient Stated Goal: not stated OT Goal Formulation: With patient Time For Goal Achievement: 05/09/13 Potential to Achieve Goals: Good ADL Goals Pt Will Perform Lower Body Dressing: with min assist;with adaptive equipment;sit to/from stand;with caregiver independent in assisting Pt Will Transfer to Toilet: with min assist;stand pivot transfer;bedside commode Pt Will Perform Toileting - Clothing Manipulation and hygiene: with min assist;sit to/from stand Additional ADL Goal #1: Pt will independently perform HEP to increase strength in bilateral UE's.  Visit Information  Last OT Received On:  05/02/13 Assistance Needed: +2 PT/OT/SLP Co-Evaluation/Treatment: Yes Reason for Co-Treatment: Complexity of the patient's impairments (multi-system involvement);For patient/therapist safety PT  goals addressed during session: Balance;Mobility/safety with mobility;Proper use of DME;Strengthening/ROM OT goals addressed during session: ADL's and self-care History of Present Illness: Pt is a 73 y/o male admitted s/p I&D of L great toe infection. Pt fell and had a laceration to his toe 2 weeks ago, and failed oral antibiotics. He is now NWB L foot and has h/o stroke with left sided weakness.         Prior Functioning     Home Living Family/patient expects to be discharged to:: Private residence Living Arrangements: Spouse/significant other Available Help at Discharge: Family;Available 24 hours/day Type of Home: House Home Access: Ramped entrance Home Layout: One level Home Equipment: Walker - 2 wheels;Cane - single point;Grab bars - toilet;Tub bench Prior Function Level of Independence: Needs assistance Gait / Transfers Assistance Needed: used walker? unclear ADL's / Homemaking Assistance Needed: Wife assisted with bathing and dressing Communication Communication: HOH Dominant Hand: Right         Vision/Perception     Cognition  Cognition Arousal/Alertness: Awake/alert Behavior During Therapy: WFL for tasks assessed/performed Overall Cognitive Status: Within Functional Limits for tasks assessed    Extremity/Trunk Assessment Upper Extremity Assessment Upper Extremity Assessment: Generalized weakness;LUE deficits/detail LUE Deficits / Details: previous stroke-weakness in LUE; edema in left hand Lower Extremity Assessment Lower Extremity Assessment: Defer to PT evaluation     Mobility   Transfers Overall transfer level: Needs assistance Equipment used: Rolling walker (2 wheeled) Transfers: Sit to/from [employer] CorporationStand;Stand Pivot Transfers Sit to Stand: +2 physical assistance;+2  safety/equipment;Min assist;Mod assist; Max A Stand pivot transfers:+2 physical assistance;Mod assist General transfer comment: Pt unable to maintain weightbearing status on LLE. Attempted to stand with +2 people with one person supporting LLE to prevent pt from putting weight through it, and pt unable to achieve standing position (attempted a few times). Pt having to toilet, so performed stand pivot [facility] with +2 Assist and pt was unable to maintain NWB status.            End of Session OT - End of Session Equipment Utilized During Treatment: Gait belt;Rolling walker Patient left: in bed;Other (comment);with family/visitor present (with PT)  GO     Earlie RavelingStraub, Zuria Fosdick L OTR/L [phone] 05/02/2013, 1:45 PM

## 2013-05-02 NOTE — Progress Notes (Signed)
ANTIBIOTIC CONSULT NOTE - Follow up  Pharmacy Consult for vancomycin Indication: left great toe infection  No Known Allergies  Patient Measurements: Height: 6\' 2"  (188 cm) Weight: 304 lb 14.3 oz (138.3 kg) IBW/kg (Calculated) : 82.2  Vital Signs: Temp: 98.8 F (37.1 C) (02/14 0437) Temp src: Oral (02/14 0437) BP: 150/82 mmHg (02/14 0437) Pulse Rate: 95 (02/14 0437) Intake/Output from previous day: 02/13 0701 - 02/14 0700 In: 340 [I.V.:340] Out: 1200 [Urine:1200] Intake/Output from this shift:    Labs:  Recent Labs  04/30/13 1131 05/02/13 0515  WBC 6.3  --   HGB 14.8  --   PLT 236  --   CREATININE 1.92* 1.42*   Estimated Creatinine Clearance: 71.6 ml/min (by C-G formula based on Cr of 1.42). No results found for this basename: VANCOTROUGH, VANCOPEAK, VANCORANDOM, GENTTROUGH, GENTPEAK, GENTRANDOM, TOBRATROUGH, TOBRAPEAK, TOBRARND, AMIKACINPEAK, AMIKACINTROU, AMIKACIN,  in the last 72 hours   Assessment: 45 YOM who is s/p I&D of left great toe for infection/cellulitis s/p lacteration and failing PO antibiotics. No mention of osteomyelitis. Currently on Vancomycin 1750 mg IV Q 24 hours. SCr has trended down today from 1.91>>1.42. Will increase Vanc dose. Cultures remain negative. Pt is afebrile. S/p I&D on great toe infection   2/13 wound: ngtd   vanc 2/13>>  Goal of Therapy:  Vancomycin trough level 10-15 mcg/ml  Plan:  1. Increase vancomycin to 1250mg  IV Q 12 hours  2. Follow c/s, clinical progression, LOT, renal function, trough at [facility], PharmD.  Clinical Pharmacist Pager [phone]

## 2013-05-02 NOTE — Progress Notes (Signed)
Patient ID: Nathan Zimmerman, male   DOB: [birth year], 73 y.o.   MRN: [id number] Patient is status post irrigation and debridement for great toe infection. Dressing clean and dry. Continue IV antibiotics. Plan for discharge Monday.

## 2013-05-02 NOTE — Progress Notes (Signed)
Physical Therapy Treatment Patient Details Name: Nathan Zimmerman MRN: [id number] DOB: 09-25-1940 Today's Date: 05/02/2013 Time: 3235-5732 PT Time Calculation (min): 20 min  PT Assessment / Plan / Recommendation  History of Present Illness Pt is a 73 y/o male admitted s/p I&D of L great toe infection. Pt fell and had a laceration to his toe 2 weeks ago, and failed oral antibiotics. He is now NWB L foot and has h/o stroke with left sided weakness.     PT Comments   Pt is unable to even maintain PWB on L foot when standing.  He is very limited in his mobility and has poor strength throughout his arms and legs.  If MD truly wants him to be NWB on his left foot he will need to be transfers only until WB status can be increased.  He needs a significant amount of assistance to move into and out of bed and transfer to [facility].  He would be safer going to SNF for rehab until he can be WBAT on his left foot.  Discussed this with pt and wife and pt adamately refusing SNF for rehab.       Follow Up Recommendations  SNF     Does the patient have the potential to tolerate intense rehabilitation    NA  Barriers to Discharge   I do not believe pt will be able to maintain NWB on his left foot and I do not believe his wife can physically handle him at home without risk of falling or putting too much pressure on pt's left foot.  The good news is he does have a ramp to enter his home.  If wife can demonstrate the ability to get him up OOB to [facility] and MD can allow him to be PWB 50% for transfers and no gait, then he may be able to go home.  I would still endorse SNF for rehab before this option for his and his wife's safety.        Equipment Recommendations  Wheelchair (measurements PT);Wheelchair cushion (measurements PT);Other (comment)    Recommendations for Other Services   NA  Frequency Min 3X/week   Progress towards PT Goals Progress towards PT goals: Progressing toward goals  Plan Discharge plan needs to be  updated    Precautions / Restrictions Precautions Precautions: Fall Precaution Comments: left sided weakness at baseline, unable to maintain NWB left foot.  Required Braces or Orthoses: Other Brace/Splint Other Brace/Splint: PT suggested DARCO shoe (fx shoe in room ) to help protect left foot since pt is supposed to be NWB.   Restrictions Weight Bearing Restrictions: Yes LLE Weight Bearing: Non weight bearing   Pertinent Vitals/Pain See vitals flow sheet. Pt with significant DOE 2-3/4 with mobility and even with bed exercises.    Mobility  Bed Mobility Overal bed mobility: Needs Assistance Bed Mobility: Sit to Supine Sit to supine: Mod assist General bed mobility comments: mod assist to help bil legs into the bed.  Transfers Overall transfer level: Needs assistance Equipment used: Rolling walker (2 wheeled) Transfers: Sit to/from Stand Sit to Stand: +2 safety/equipment;Min assist General transfer comment: Two person for safety during transition from [facility] to bed pulled under pt.  Pt is putting a significant amount of weight through his left leg, so transfer was not attempted.  Pt is unable to unweight his left foot at all in standing.  Ambulation/Gait Ambulation/Gait assistance:  (NT due to inability to maintain NWB L )    Exercises Total  Joint Exercises Ankle Circles/Pumps: AROM;Both;10 reps;Supine;Seated Quad Sets: AROM;Both;10 reps;Supine Towel Squeeze: AROM;Both;10 reps;Supine Short Arc Quad: AROM;AAROM;Right;Left;10 reps;Supine Heel Slides: AROM;AAROM;Right;Left;10 reps;Supine Hip ABduction/ADduction: AAROM;Both;10 reps;Supine Long Arc Quad: AROM;AAROM;Right;Left;10 reps;Seated General Exercises - Lower Extremity Hip Flexion/Marching: AROM;Both;10 reps;Seated    PT Goals (current goals can now be found in the care plan section) Acute Rehab PT Goals Patient Stated Goal: to go home  Visit Information  Last PT Received On: 05/02/13 Assistance Needed: +2 (for safety and to  maintain NWB L foot) PT/OT/SLP Co-Evaluation/Treatment: Yes (only overlaped 4 min) Reason for Co-Treatment: For patient/therapist safety;Complexity of the patient's impairments (multi-system involvement) PT goals addressed during session: Balance;Mobility/safety with mobility;Proper use of DME;Strengthening/ROM History of Present Illness: Pt is a 73 y/o male admitted s/p I&D of L great toe infection. Pt fell and had a laceration to his toe 2 weeks ago, and failed oral antibiotics. He is now NWB L foot and has h/o stroke with left sided weakness.      Subjective Data  Subjective: Pt refusing SNF as option.   Patient Stated Goal: to go home   Cognition  Cognition Arousal/Alertness: Awake/alert Behavior During Therapy: WFL for tasks assessed/performed Overall Cognitive Status: Within Functional Limits for tasks assessed (Not specifically tested, but pt is HOH, have to repeat freq)    Balance  Balance Overall balance assessment: Needs assistance Sitting-balance support: Bilateral upper extremity supported;Feet supported Sitting balance-Leahy Scale: Good Standing balance support: Bilateral upper extremity supported Standing balance-Leahy Scale: Poor  End of Session PT - End of Session Equipment Utilized During Treatment: Gait belt Activity Tolerance: Patient limited by fatigue;Patient limited by pain Patient left: in bed;with call bell/phone within reach;with family/visitor present     Lurena Joinerebecca B. Jarell Mcewen, PT, DPT [phone]   05/02/2013, 12:30 PM

## 2013-05-03 LAB — COMPREHENSIVE METABOLIC PANEL
ALK PHOS: 190 U/L — AB (ref 39–117)
ALT: 68 U/L — ABNORMAL HIGH (ref 0–53)
AST: 76 U/L — AB (ref 0–37)
Albumin: 2.6 g/dL — ABNORMAL LOW (ref 3.5–5.2)
BUN: 17 mg/dL (ref 6–23)
CO2: 26 meq/L (ref 19–32)
Calcium: 8.8 mg/dL (ref 8.4–10.5)
Chloride: 102 mEq/L (ref 96–112)
Creatinine, Ser: 0.8 mg/dL (ref 0.50–1.35)
GFR calc Af Amer: >90 mL/min (ref >90)
GFR calc non Af Amer: 88 mL/min — ABNORMAL LOW (ref >90)
GLUCOSE: 90 mg/dL (ref 70–99)
Potassium: 4.2 mEq/L (ref 3.7–5.3)
SODIUM: 137 meq/L (ref 137–147)
Total Bilirubin: 2.4 mg/dL — ABNORMAL HIGH (ref 0.3–1.2)
Total Protein: 6.2 g/dL (ref 6.0–8.3)

## 2013-05-03 LAB — GLUCOSE, CAPILLARY
GLUCOSE-CAPILLARY: 113 mg/dL — AB (ref 70–99)
GLUCOSE-CAPILLARY: 124 mg/dL — AB (ref 70–99)
GLUCOSE-CAPILLARY: 126 mg/dL — AB (ref 70–99)
Glucose-Capillary: 120 mg/dL — ABNORMAL HIGH (ref 70–99)

## 2013-05-03 LAB — WOUND CULTURE
CULTURE: NO GROWTH
GRAM STAIN: NONE SEEN

## 2013-05-03 LAB — CLOSTRIDIUM DIFFICILE BY PCR: CDIFFPCR: NEGATIVE

## 2013-05-03 MED ORDER — LOPERAMIDE HCL 2 MG PO CAPS
4.0000 mg | ORAL_CAPSULE | ORAL | Status: DC | PRN
  Administered 2013-05-03: 4 mg via ORAL
  Filled 2013-05-03: qty 2

## 2013-05-03 NOTE — Progress Notes (Signed)
   CARE MANAGEMENT NOTE 05/03/2013  Patient:  Nathan Zimmerman, Nathan Zimmerman   Account Number:  [id number]  Date Initiated:  05/02/2013  Documentation initiated by:  [facility]  Subjective/Objective Assessment:   adm: Left great toe infection/irrigation and debridement for great toe infection     Action/Plan:   discharge planning   Anticipated DC Date:  05/04/2013   Anticipated DC Plan:  HOME W HOME HEALTH SERVICES      DC Planning Services  CM consult      [facility] Choice  HOME HEALTH   Choice offered to / List presented to:  C-1 Patient   DME arranged  3-N-1  Levan Hurst      DME agency  [facility]     HH arranged  HH-2 PT  HH-1 RN  HH-6 SOCIAL WORKER      [facility] agency  [facility]   Status of service:  In process, will continue to follow Medicare Important Message given?   (If response is "NO", the following Medicare IM given date fields will be blank) Date Medicare IM given:   Date Additional Medicare IM given:    Discharge Disposition:  HOME W HOME HEALTH SERVICES  Per UR Regulation:    If discussed at Long Length of Stay Meetings, dates discussed:    Comments:  05/03/13 12:10 CM spoke with Lurena Joiner (wife of pt) to clarify disposition request.  CW contacted this CM to state pt is adamant in NOT going to SNF.  Lurena Joiner confirms pt will be going home with home health and choice is [facility].  Referral faxed to [facility] with orders pending. Sticky note left for MD requesting HH orders, including SW in case pt and wife change mind after they get home.  3n1 and RW to be delivered to room prior to discharge.    Will continue to monitor for [facility] orders.  Freddy Jaksch, BSN, [place] Ada [phone].   05/02/13 09:00 CM spoke with pt concerning disposition but pt and wife, Lurena Joiner, to make decision later.  CSW aware as SNF is recc.  Will continue to monitor.  Freddy Jaksch, BSN, CM [phone].

## 2013-05-03 NOTE — Progress Notes (Signed)
Clinical Social Worker (CSW) met with patient and his wife [name] at bedside. Patient and and wife refused SNF and reported that they want to go home and are already set up with [place]. CSW explained that PT will only come out to the house maxium 3 days a week with home health and that patient would get 4 to 5 days of PT in a SNF. Patient and wife verbalized their understanding. Wife reported that she recently retired in January and is the primary caregiver and will provide 24 hour assistance. CSW made PT and RN case manager aware of above. Please reconsult if further social work needs arise. CSW signing off.   Blima Rich, [place] Weekend CSW [phone]

## 2013-05-03 NOTE — Progress Notes (Signed)
Patient ID: EUFEMIO STFLEUR, male   DOB: [birth year], 73 y.o.   MRN: [id number] Left great toe dressing clean dry and intact. Patient incontinent this morning. Wound evaluation on Monday for possible discharge.

## 2013-05-04 LAB — COMPREHENSIVE METABOLIC PANEL
ALBUMIN: 3.6 g/dL (ref 3.5–5.2)
ALK PHOS: 96 U/L (ref 39–117)
ALT: 43 U/L (ref 0–53)
AST: 69 U/L — ABNORMAL HIGH (ref 0–37)
BUN: 23 mg/dL (ref 6–23)
CALCIUM: 8.9 mg/dL (ref 8.4–10.5)
CO2: 24 mEq/L (ref 19–32)
Chloride: 103 mEq/L (ref 96–112)
Creatinine, Ser: 1.46 mg/dL — ABNORMAL HIGH (ref 0.50–1.35)
GFR calc non Af Amer: 47 mL/min — ABNORMAL LOW (ref >90)
GFR, EST AFRICAN AMERICAN: 54 mL/min — AB (ref >90)
GLUCOSE: 158 mg/dL — AB (ref 70–99)
POTASSIUM: 3.8 meq/L (ref 3.7–5.3)
Sodium: 143 mEq/L (ref 137–147)
TOTAL PROTEIN: 7.8 g/dL (ref 6.0–8.3)
Total Bilirubin: 0.6 mg/dL (ref 0.3–1.2)

## 2013-05-04 LAB — PROTIME-INR
INR: 1.84 — ABNORMAL HIGH (ref 0.00–1.49)
PROTHROMBIN TIME: 20.7 s — AB (ref 11.6–15.2)

## 2013-05-04 LAB — GLUCOSE, CAPILLARY: GLUCOSE-CAPILLARY: 107 mg/dL — AB (ref 70–99)

## 2013-05-04 MED ORDER — SULFAMETHOXAZOLE-TMP DS 800-160 MG PO TABS: 1.0000 | ORAL_TABLET | Freq: Two times a day (BID) | ORAL | Status: DC

## 2013-05-04 NOTE — Discharge Summary (Signed)
Physician Discharge Summary      Patient ID: Nathan Zimmerman Tworek MRN: [id number] DOB/AGE: 18-Feb-1941 73 y.o.  Admit date: 05/01/2013 Discharge date: 05/04/2013  Admission Diagnoses:  Great toe infection   Discharge Diagnoses:  Active Problems:   Toe infection   Past Medical History  Diagnosis Date  . Atrial fibrillation   . COPD (chronic obstructive pulmonary disease)   . Diabetes mellitus, type 2   . 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 Arthritis   . Renal insufficiency   . Hypertension   . Dysrhythmia     Afib  . CHF (congestive heart failure)   . Shortness of breath   . 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  . On home oxygen therapy     at night  . GERD (gastroesophageal reflux disease)   . Constipation   . Neuropathy associated with endocrine disorder   . HOH (hard of hearing)     Surgeries: Procedure(s): IRRIGATION AND DEBRIDEMENT LEFT GREAT TOE on 05/01/2013   Consultants (if any):    Discharged Condition: Improved  Hospital Course: Nathan Zimmerman Stick is an 73 y.o. male who was admitted 05/01/2013 with a diagnosis of left great toe infection and went to the operating room on 05/01/2013 and underwent the above named procedures.  He improved during his hospital course. Intraoperative cultures were negative.  He was discharged on 10 days of bactrim.  His incision remained clean, dry and intact and without signs of infection.  He was able to perform PT.  He was given perioperative antibiotics:      Anti-infectives   Start     Dose/Rate Route Frequency Ordered Stop   05/04/13 0000  sulfamethoxazole-trimethoprim (BACTRIM DS) 800-160 MG per tablet     1 tablet Oral 2 times daily 05/04/13 0707     05/02/13 1500  vancomycin (VANCOCIN) 1,750 mg in sodium chloride 0.9 % 500 mL IVPB  Status:  Discontinued     1,750 mg 250 mL/hr over 120 Minutes Intravenous Every 24 hours 05/01/13 1413 05/02/13 1248   05/02/13 1400   vancomycin (VANCOCIN) 1,250 mg in sodium chloride 0.9 % 250 mL IVPB     1,250 mg 166.7 mL/hr over 90 Minutes Intravenous Every 12 hours 05/02/13 1248     05/01/13 1500  vancomycin (VANCOCIN) IVPB 750 mg/150 ml premix     750 mg 150 mL/hr over 60 Minutes Intravenous  Once 05/01/13 1413 05/01/13 1625   05/01/13 1230  vancomycin (VANCOCIN) 1,250 mg in sodium chloride 0.9 % 250 mL IVPB     1,250 mg 166.7 mL/hr over 90 Minutes Intravenous  Once 05/01/13 1216 05/01/13 1330    .  He was given sequential compression devices, early ambulation for DVT prophylaxis.  He benefited maximally from the hospital stay and there were no complications.    Recent vital signs:  Filed Vitals:   05/04/13 0537  BP: 155/71  Pulse: 75  Temp: 97.8 F (36.6 C)  Resp: 15    Recent laboratory studies:  Lab Results  Component Value Date   HGB 14.8 04/30/2013   HGB 16.4 05/17/2009   HGB 16.1 05/16/2009   Lab Results  Component Value Date   WBC 6.3 04/30/2013   PLT 236 04/30/2013   Lab Results  Component Value Date   INR 2.20* 05/01/2013   Lab Results  Component Value Date   NA 137 05/03/2013   K 4.2 05/03/2013  CL 102 05/03/2013   CO2 26 05/03/2013   BUN 17 05/03/2013   CREATININE 0.80 05/03/2013   GLUCOSE 90 05/03/2013    Discharge Medications:     Medication List         atorvastatin 40 MG tablet  Commonly known as:  LIPITOR  Take 40 mg by mouth daily.     benazepril 20 MG tablet  Commonly known as:  LOTENSIN  Take 20 tablets by mouth Daily.     diltiazem 240 MG 24 hr capsule  Commonly known as:  TIAZAC  Take 240 mg by mouth daily.     doxycycline 100 MG capsule  Commonly known as:  VIBRAMYCIN  Take 1 capsule (100 mg total) by mouth 2 (two) times daily.     EYE DROPS OP  Place 2 drops into both eyes daily as needed (dry eyes).     Fish Oil 1000 MG Caps  Take 1 capsule by mouth 3 (three) times daily.     furosemide 80 MG tablet  Commonly known as:  LASIX  Take 80 mg by mouth 2  (two) times daily.     gabapentin 600 MG tablet  Commonly known as:  NEURONTIN  Take 600 mg by mouth 3 (three) times daily.     gemfibrozil 600 MG tablet  Commonly known as:  LOPID  Take 600 tablets by mouth daily.     glimepiride 2 MG tablet  Commonly known as:  AMARYL  Take 2 mg by mouth Daily.     HYDROcodone-acetaminophen 10-325 MG per tablet  Commonly known as:  NORCO  Take 1 tablet by mouth every 8 (eight) hours as needed for moderate pain.     insulin NPH-regular Human (70-30) 100 UNIT/ML injection  Commonly known as:  NOVOLIN 70/30  Inject 28 Units into the skin 2 (two) times daily with a meal.     metFORMIN 500 MG tablet  Commonly known as:  GLUCOPHAGE  Take 500 mg by mouth Twice daily.     metoprolol 200 MG 24 hr tablet  Commonly known as:  TOPROL-XL  Take 1 tablet (200 mg total) by mouth daily.     potassium chloride SA 20 MEQ tablet  Commonly known as:  K-DUR,KLOR-CON  Take 20 mEq by mouth daily.     sulfamethoxazole-trimethoprim 800-160 MG per tablet  Commonly known as:  BACTRIM DS  Take 1 tablet by mouth 2 (two) times daily.     sulfamethoxazole-trimethoprim 800-160 MG per tablet  Commonly known as:  BACTRIM DS  Take 1 tablet by mouth 2 (two) times daily.     warfarin 5 MG tablet  Commonly known as:  COUMADIN  Take 5-7.5 mg by mouth daily. 1 tablet (5mg ) on Sunday, Monday, Wednesday, Friday. 2 1/2 tablet (7.5mg ) on Tuesday, Thursday and Saturday.        Diagnostic Studies: Dg Chest 2 View  04/30/2013   CLINICAL DATA:  I and D foot, diabetes  EXAM: CHEST  2 VIEW  COMPARISON:  05/10/2009  FINDINGS: Cardiomegaly again noted. Central mild vascular congestion without convincing pulmonary edema. Mild basilar atelectasis. Bony thorax is unremarkable. No focal infiltrate.  IMPRESSION: Cardiomegaly. Central vascular congestion without convincing pulmonary edema. Mild basilar atelectasis. No segmental infiltrate.   Electronically Signed   By: Natasha Mead M.D.    On: 04/30/2013 13:34    Disposition: 01-Home or Self Care  Discharge Orders   Future Appointments Provider Department Dept Phone   07/09/2013 10:00 AM Jonelle Sidle,  MD [facility] [phone]   Future Orders Complete By Expires   Call MD / Call 911  As directed    Comments:     If you experience chest pain or shortness of breath, CALL 911 and be transported to the hospital emergency room.  If you develope a fever above 101.5 F, pus (white drainage) or increased drainage or redness at the wound, or calf pain, call your surgeon's office.   Constipation Prevention  As directed    Comments:     Drink plenty of fluids.  Prune juice may be helpful.  You may use a stool softener, such as Colace (over the counter) 100 mg twice a day.  Use MiraLax (over the counter) for constipation as needed.   Diet - low sodium heart healthy  As directed    Driving restrictions  As directed    Comments:     No driving while taking narcotic pain meds.   Increase activity slowly as tolerated  As directed       Follow-up Information   Follow up with Cheral Almas, MD In 1 week.   Specialty:  Orthopedic Surgery   Contact information:   [address] [place] [address] [phone]        Signed: Cheral Almas 05/04/2013, 11:18 AM

## 2013-05-04 NOTE — Progress Notes (Signed)
Physical Therapy Treatment Patient Details Name: Nathan Zimmerman MRN: [id number] DOB: Oct 03, 1940 Today's Date: 05/04/2013 Time: 5945-8592 PT Time Calculation (min): 23 min  PT Assessment / Plan / Recommendation  History of Present Illness Pt is a 73 y/o male admitted s/p I&D of L great toe infection. Pt fell and had a laceration to his toe 2 weeks ago, and failed oral antibiotics. He is now NWB L foot and has h/o stroke with left sided weakness.     PT Comments   Focus of session to problem-solve ways to maintain NWB status on LLE. Pt declined any/all options presented to him, including use of slide board. Pt has a w/c at home and reports he usually only transfers bed<>w/c or w/c<>lift chair during the day. As pt is refusing any short term rehab at d/c, therapist discussed safety awareness, precautions, and positioning for home. Pt and wife anticipate d/c this morning.  Follow Up Recommendations  SNF     Does the patient have the potential to tolerate intense rehabilitation     Barriers to Discharge        Equipment Recommendations  None recommended by PT    Recommendations for Other Services    Frequency Min 3X/week   Progress towards PT Goals Progress towards PT goals: Not progressing toward goals - comment  Plan Current plan remains appropriate    Precautions / Restrictions Precautions Precautions: Fall Precaution Comments: left sided weakness at baseline, unable to maintain NWB left foot.  Required Braces or Orthoses: Other Brace/Splint Other Brace/Splint: PT suggested DARCO shoe (fx shoe in room ) to help protect left foot since pt is supposed to be NWB.  Per RN, MD not wanting DARCO shoe to maintain skin integrity. Restrictions Weight Bearing Restrictions: Yes LLE Weight Bearing: Non weight bearing   Pertinent Vitals/Pain Pt reports pain but does not rate 0-10 when asked.    Mobility  Bed Mobility Overal bed mobility: Needs Assistance General bed mobility comments: NT  - pt received sitting up in recliner Transfers Overall transfer level: Needs assistance Equipment used: Rolling walker (2 wheeled) Transfers: Sit to/from [employer];Lateral/Scoot Transfers Sit to Stand: Min assist Stand pivot transfers: Min assist  Lateral/Scoot Transfers: Min guard;With slide board General transfer comment: Pt unwilling to try to maintain NWB on L. Was able to transfer from bed to w/c with slide board, however states he will not use a slide board at home due to inconvenience. Ambulation/Gait General Gait Details: NT due to inability to maintain NWB status.     Exercises     PT Diagnosis:    PT Problem List:   PT Treatment Interventions:     PT Goals (current goals can now be found in the care plan section) Acute Rehab PT Goals Patient Stated Goal: To return home. PT Goal Formulation: With patient Time For Goal Achievement: 05/08/13 Potential to Achieve Goals: Good  Visit Information  Last PT Received On: 05/04/13 Assistance Needed: +2 (Safety) History of Present Illness: Pt is a 73 y/o male admitted s/p I&D of L great toe infection. Pt fell and had a laceration to his toe 2 weeks ago, and failed oral antibiotics. He is now NWB L foot and has h/o stroke with left sided weakness.      Subjective Data  Subjective: Pt refusing SNF as option, as well as trying to maintain NWB or wearing post-op shoe.  Patient Stated Goal: To return home.   Cognition  Cognition Arousal/Alertness: Awake/alert Behavior During Therapy: [facility]  for tasks assessed/performed Overall Cognitive Status: Within Functional Limits for tasks assessed    Balance  Balance Overall balance assessment: Needs assistance;History of Falls Sitting-balance support: Feet supported;Bilateral upper extremity supported Sitting balance-Leahy Scale: Good Standing balance support: Bilateral upper extremity supported Standing balance-Leahy Scale: Poor Standing balance comment: Unable to  attempt to lift LLE due to balance and decreased strength  End of Session PT - End of Session Equipment Utilized During Treatment: Gait belt;Other (comment) (Slide board) Activity Tolerance: Patient limited by fatigue Patient left: in chair;with call bell/phone within reach;with family/visitor present Nurse Communication: Mobility status;Weight bearing status   GP     Nathan Zimmerman, Nathan Zimmerman 05/04/2013, 11:26 AM  Nathan Zimmerman, PT, DPT [phone]

## 2013-05-04 NOTE — Progress Notes (Signed)
   Subjective:  Patient reports pain as mild.    Objective:   VITALS:   Filed Vitals:   05/03/13 0659 05/03/13 1258 05/03/13 2119 05/04/13 0537  BP: 136/81 122/73 145/79 155/71  Pulse: 74 106 87 75  Temp: 98.2 F (36.8 C) 97.8 F (36.6 C) 98 F (36.7 C) 97.8 F (36.6 C)  TempSrc: Oral Oral Oral Oral  Resp:  18 19 15   Height:      Weight:      SpO2: 98% 94% 98% 100%    Incision: dressing C/D/I and no drainage   Lab Results  Component Value Date   WBC 6.3 04/30/2013   HGB 14.8 04/30/2013   HCT 44.9 04/30/2013   MCV 91.1 04/30/2013   PLT 236 04/30/2013     Assessment/Plan: 3 Days Post-Op   Problem List Items Addressed This Visit     Other   Toe infection - Primary   Relevant Medications      sulfamethoxazole-trimethoprim (BACTRIM DS) 800-160 MG per tablet      vancomycin (VANCOCIN) IVPB 750 mg/150 ml premix (Completed)      sulfamethoxazole-trimethoprim (BACTRIM DS) tablet 800-160 mg   Other Relevant Orders      Call MD / Call 911      Diet - low sodium heart healthy      Constipation Prevention      Increase activity slowly as tolerated      Driving restrictions      Up with PT/OT DVT ppx - SCDs, ambulation NWB left lower extremity  Postop shoe when up Home with 10 days of bactrim Silvadene ointment to toe blisters daily Pain control Discharge planning - home today F/u 1 week   Nathan Zimmerman 05/04/2013, 9:54 AM [phone]

## 2013-05-04 NOTE — Progress Notes (Signed)
Pt discharged to home. D/c instructions given, no questions verbalized. Vitals stable. 

## 2013-05-04 NOTE — Discharge Instructions (Signed)
1. Change dressing daily. 2. Apply silvadene to incision and blisters daily 3. Non weight bearing to foot 4. Stay in postop shoe at all times

## 2013-05-05 ENCOUNTER — Encounter (HOSPITAL_COMMUNITY): Payer: Self-pay | Admitting: Orthopaedic Surgery

## 2013-05-05 LAB — ANAEROBIC CULTURE: Gram Stain: NONE SEEN

## 2013-05-12 ENCOUNTER — Telehealth: Payer: Self-pay | Admitting: Nurse Practitioner

## 2013-05-12 ENCOUNTER — Telehealth: Payer: Self-pay | Admitting: *Deleted

## 2013-05-12 NOTE — Telephone Encounter (Signed)
Received call from [facility].  Pt in [facility] 2/13 - 2/16  Dx: Lt great toe infection.  [facility] seeing pt today for admission to their services.  INR was obtained since pt was not scheduled with F/U at discharge.  POC INR today >8.0.  No bleeding reported.  Pt was started on Bactrim DS BID x 10 days @ D/C.  Pt d/c on warfarin 5mg  qd except 7.5mg  on T,Th,Sat.  Order given for [facility] RN to obtain venipuncture for stat PT/INR with results to be called to [facility] PA/MD on call.  Pt was told to hold coumadin till further notice.  Bleeding and fall precautions discussed with pt and wife via phone and pt instructed to go to ED for any mentioned complications.  They verbalized understanding.

## 2013-05-12 NOTE — Telephone Encounter (Signed)
Received phone call from the Lab Dept at [facility] - INR is 6.3.  Called patient who asked me to speak with his wife - I have instructed her to hold his coumadin today, tomorrow and recheck on Thursday.   She tells me that the home health nurse is coming out Friday  - we can continue to hold his coumadin and just recheck on Friday. She understands to call for any problems.   Patient's wife is agreeable to this plan and will call if any problems develop in the interim.   Rosalio Macadamia, RN, ANP-C [facility] [address] [place], [place]  [address] [phone]

## 2013-05-12 NOTE — Telephone Encounter (Signed)
Phone call from [place], [place] with [facility].  She was at Mr. Cail' home today for a wound check. Has had recent surgery for broken toe. On Bactrim. INR at discharge was 1.8.   She decided to check his INR today while there - her machine is reading "8". She was unsuccessful in getting a venipuncture. Wanting order to go to [facility] for venipuncture INR which was verbally given.  She is to have him hold his coumadin. No active bleeding reported.   I have talked with Riki Rusk the pharmacist here at our office - he agrees with the recheck by venipuncture. If the reading is true we will then plan to hold his coumadin today and Wednesday. Increase intake of greens. Will only use vitamin K if INR 10 or greater since no obvious bleeding. Will need a recheck on Thursday.   Rosalio Macadamia, RN, ANP-C [facility] [address] [place], [place]  [address] [phone]

## 2013-05-12 NOTE — Telephone Encounter (Signed)
error 

## 2013-05-15 ENCOUNTER — Ambulatory Visit (INDEPENDENT_AMBULATORY_CARE_PROVIDER_SITE_OTHER): Payer: Medicare Other | Admitting: *Deleted

## 2013-05-15 DIAGNOSIS — Z7901 Long term (current) use of anticoagulants: Secondary | ICD-10-CM

## 2013-05-15 DIAGNOSIS — I4891 Unspecified atrial fibrillation: Secondary | ICD-10-CM

## 2013-05-15 LAB — POCT INR: INR: 2.5

## 2013-05-18 ENCOUNTER — Telehealth: Payer: Self-pay | Admitting: *Deleted

## 2013-05-18 ENCOUNTER — Ambulatory Visit (INDEPENDENT_AMBULATORY_CARE_PROVIDER_SITE_OTHER): Payer: Medicare Other | Admitting: *Deleted

## 2013-05-18 DIAGNOSIS — I4891 Unspecified atrial fibrillation: Secondary | ICD-10-CM

## 2013-05-18 DIAGNOSIS — Z7901 Long term (current) use of anticoagulants: Secondary | ICD-10-CM

## 2013-05-18 DIAGNOSIS — Z5181 Encounter for therapeutic drug level monitoring: Secondary | ICD-10-CM | POA: Insufficient documentation

## 2013-05-18 LAB — POCT INR: INR: 1.9

## 2013-05-18 NOTE — Telephone Encounter (Signed)
INR 1.9 / PT 23.4 / please call with instructions / tgs

## 2013-05-18 NOTE — Telephone Encounter (Signed)
See coumadin note. 

## 2013-05-22 ENCOUNTER — Ambulatory Visit (INDEPENDENT_AMBULATORY_CARE_PROVIDER_SITE_OTHER): Payer: Medicare Other | Admitting: *Deleted

## 2013-05-22 DIAGNOSIS — Z5181 Encounter for therapeutic drug level monitoring: Secondary | ICD-10-CM

## 2013-05-22 DIAGNOSIS — I4891 Unspecified atrial fibrillation: Secondary | ICD-10-CM

## 2013-05-22 LAB — POCT INR: INR: 2.5

## 2013-05-27 ENCOUNTER — Ambulatory Visit (INDEPENDENT_AMBULATORY_CARE_PROVIDER_SITE_OTHER): Payer: Medicare Other | Admitting: *Deleted

## 2013-05-27 DIAGNOSIS — Z5181 Encounter for therapeutic drug level monitoring: Secondary | ICD-10-CM

## 2013-05-27 DIAGNOSIS — I4891 Unspecified atrial fibrillation: Secondary | ICD-10-CM

## 2013-05-27 LAB — POCT INR: INR: 2.5

## 2013-06-02 ENCOUNTER — Other Ambulatory Visit: Payer: Self-pay | Admitting: Cardiology

## 2013-06-10 ENCOUNTER — Ambulatory Visit (INDEPENDENT_AMBULATORY_CARE_PROVIDER_SITE_OTHER): Payer: Medicare Other | Admitting: *Deleted

## 2013-06-10 ENCOUNTER — Telehealth: Payer: Self-pay | Admitting: *Deleted

## 2013-06-10 DIAGNOSIS — I4891 Unspecified atrial fibrillation: Secondary | ICD-10-CM

## 2013-06-10 DIAGNOSIS — Z5181 Encounter for therapeutic drug level monitoring: Secondary | ICD-10-CM

## 2013-06-10 LAB — POCT INR: INR: 1.4

## 2013-06-10 NOTE — Telephone Encounter (Signed)
INR 1.4 / please call with instructions / tgs

## 2013-06-10 NOTE — Telephone Encounter (Signed)
See coumadin note. 

## 2013-06-16 ENCOUNTER — Ambulatory Visit (INDEPENDENT_AMBULATORY_CARE_PROVIDER_SITE_OTHER): Payer: Medicare Other | Admitting: *Deleted

## 2013-06-16 DIAGNOSIS — Z5181 Encounter for therapeutic drug level monitoring: Secondary | ICD-10-CM

## 2013-06-16 DIAGNOSIS — I4891 Unspecified atrial fibrillation: Secondary | ICD-10-CM

## 2013-06-16 LAB — POCT INR: INR: 1.7

## 2013-06-23 ENCOUNTER — Ambulatory Visit (INDEPENDENT_AMBULATORY_CARE_PROVIDER_SITE_OTHER): Payer: Medicare Other | Admitting: *Deleted

## 2013-06-23 DIAGNOSIS — I4891 Unspecified atrial fibrillation: Secondary | ICD-10-CM

## 2013-06-23 DIAGNOSIS — Z5181 Encounter for therapeutic drug level monitoring: Secondary | ICD-10-CM

## 2013-06-23 LAB — POCT INR: INR: 2.1

## 2013-06-30 ENCOUNTER — Ambulatory Visit (INDEPENDENT_AMBULATORY_CARE_PROVIDER_SITE_OTHER): Payer: Medicare Other | Admitting: *Deleted

## 2013-06-30 DIAGNOSIS — I4891 Unspecified atrial fibrillation: Secondary | ICD-10-CM

## 2013-06-30 DIAGNOSIS — Z5181 Encounter for therapeutic drug level monitoring: Secondary | ICD-10-CM

## 2013-06-30 LAB — POCT INR: INR: 2.8

## 2013-07-09 ENCOUNTER — Encounter: Payer: Self-pay | Admitting: Cardiology

## 2013-07-09 ENCOUNTER — Ambulatory Visit (INDEPENDENT_AMBULATORY_CARE_PROVIDER_SITE_OTHER): Payer: Medicare Other | Admitting: Cardiology

## 2013-07-09 VITALS — BP 156/85 | HR 75 | Ht 74.0 in

## 2013-07-09 DIAGNOSIS — I4891 Unspecified atrial fibrillation: Secondary | ICD-10-CM

## 2013-07-09 DIAGNOSIS — I5022 Chronic systolic (congestive) heart failure: Secondary | ICD-10-CM

## 2013-07-09 NOTE — Assessment & Plan Note (Signed)
Cardiomyopathy with LVEF 45%. Symptomatically stable. Continue current regimen.

## 2013-07-09 NOTE — Patient Instructions (Signed)

## 2013-07-09 NOTE — Progress Notes (Signed)
Clinical Summary Nathan Zimmerman is a 73 y.o.male last seen in October 2014. He is here with his wife today. Reports no sense of palpitations, remains functionally limited, uses a wheelchair. Seems to have a positive outlook on things, had no major complaints today. He will be seeing Dr. Olena Leatherwood later today.  Record review finds hospitalization with debridement of left great toe in February. Lab work from February 2015 showed potassium 3.8, BUN 23, creatinine 1.4, AST 69, ALT 43. He tells me that this has healed well.  No Known Allergies  Current Outpatient Prescriptions  Medication Sig Dispense Refill  . atorvastatin (LIPITOR) 40 MG tablet Take 40 mg by mouth daily.      . benazepril (LOTENSIN) 20 MG tablet Take 20 tablets by mouth Daily.      Nathan Zimmerman Kitchen diltiazem (TIAZAC) 240 MG 24 hr capsule Take 240 mg by mouth daily.      . furosemide (LASIX) 80 MG tablet Take 80 mg by mouth 2 (two) times daily.      Nathan Zimmerman Kitchen gabapentin (NEURONTIN) 600 MG tablet Take 600 mg by mouth 3 (three) times daily.      Nathan Zimmerman Kitchen gemfibrozil (LOPID) 600 MG tablet Take 600 tablets by mouth daily.       Nathan Zimmerman Kitchen glimepiride (AMARYL) 2 MG tablet Take 2 mg by mouth Daily.       Nathan Zimmerman Kitchen HYDROcodone-acetaminophen (NORCO) 10-325 MG per tablet Take 1 tablet by mouth every 8 (eight) hours as needed for moderate pain.       Nathan Zimmerman Kitchen insulin NPH-regular Human (NOVOLIN 70/30) (70-30) 100 UNIT/ML injection Inject 28 Units into the skin 2 (two) times daily with a meal.       . metFORMIN (GLUCOPHAGE) 500 MG tablet Take 500 mg by mouth Twice daily.       . metoprolol (TOPROL-XL) 200 MG 24 hr tablet Take 1 tablet (200 mg total) by mouth daily.  30 tablet  6  . Omega-3 Fatty Acids (FISH OIL) 1000 MG CAPS Take 1 capsule by mouth 3 (three) times daily.       . potassium chloride SA (K-DUR,KLOR-CON) 20 MEQ tablet Take 20 mEq by mouth daily.      . silver sulfADIAZINE (SILVADENE) 1 % cream Apply 1 application topically daily.      . Tetrahydrozoline HCl (EYE DROPS OP) Place 2  drops into both eyes daily as needed (dry eyes).      . warfarin (COUMADIN) 5 MG tablet Take 5-7.5 mg by mouth daily. 1 tablet (5mg ) on Sunday, Monday, Wednesday, Friday. 2 1/2 tablet (7.5mg ) on Tuesday, Thursday and Saturday.       No current facility-administered medications for this visit.    Past Medical History  Diagnosis Date  . Atrial fibrillation   . COPD (chronic obstructive pulmonary disease)     Home O2  . Diabetes mellitus, type 2   . 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 Arthritis   . Renal insufficiency   . Hypertension   . Stroke     January 2011  . GERD (gastroesophageal reflux disease)   . HOH (hard of hearing)     Social History Nathan Zimmerman reports that he quit smoking about 5 years ago. His smoking use included Cigarettes. He has a 120 pack-year smoking history. His smokeless tobacco use includes Chew. Mr. Orders reports that he does not drink alcohol.  Review of Systems No chest pain. Reports stable appetite. No bleeding problems on Coumadin. Otherwise as outlined.  Physical Examination Filed Vitals:   07/09/13 0953  BP: 156/85  Pulse: 75   Filed Weights    Obese male in no acute distress, sitting in wheelchair.  HEENT: Conjunctivae and lids normal, oropharynx with poor dentition.  Neck: Supple, no loud bruits.  Lungs: Diminished breath sounds throughout with scattered rhonchi, no active wheezing but prolonged expiratory phase.  Cardiac: Irregularly irregular with controlled heart rate, distant, no S3.  Abdomen: Soft, nontender.  Extremities: Chronic appearing edema,1+. Distal pulses 1+.    Problem List and Plan   Atrial fibrillation Permanent, continue strategy of heart rate control and anticoagulation. Maintain followup in the Coumadin clinic.  Chronic systolic heart failure Cardiomyopathy with LVEF 45%. Symptomatically stable. Continue current regimen.    Nathan Zimmerman, M.D., F.A.C.C.

## 2013-07-09 NOTE — Assessment & Plan Note (Signed)
Permanent, continue strategy of heart rate control and anticoagulation. Maintain followup in the Coumadin clinic.

## 2013-07-16 ENCOUNTER — Other Ambulatory Visit: Payer: Self-pay | Admitting: Cardiology

## 2013-07-16 MED ORDER — DILTIAZEM HCL ER BEADS 240 MG PO CP24: 240.0000 mg | ORAL_CAPSULE | Freq: Every day | ORAL | Status: DC

## 2013-08-04 ENCOUNTER — Ambulatory Visit (INDEPENDENT_AMBULATORY_CARE_PROVIDER_SITE_OTHER): Payer: Medicare Other | Admitting: *Deleted

## 2013-08-04 DIAGNOSIS — Z5181 Encounter for therapeutic drug level monitoring: Secondary | ICD-10-CM

## 2013-08-04 DIAGNOSIS — I4891 Unspecified atrial fibrillation: Secondary | ICD-10-CM

## 2013-08-04 LAB — POCT INR: INR: 2.7

## 2013-09-02 ENCOUNTER — Telehealth: Payer: Self-pay | Admitting: *Deleted

## 2013-09-02 NOTE — Telephone Encounter (Signed)
She had a bad machine and she is having patient go to [facility] now to get INR done will have it faxed to [facility]

## 2013-11-02 ENCOUNTER — Ambulatory Visit (INDEPENDENT_AMBULATORY_CARE_PROVIDER_SITE_OTHER): Payer: Medicare Other | Admitting: *Deleted

## 2013-11-02 ENCOUNTER — Telehealth: Payer: Self-pay | Admitting: *Deleted

## 2013-11-02 DIAGNOSIS — Z5181 Encounter for therapeutic drug level monitoring: Secondary | ICD-10-CM

## 2013-11-02 DIAGNOSIS — I4891 Unspecified atrial fibrillation: Secondary | ICD-10-CM

## 2013-11-02 LAB — POCT INR: INR: 2.7

## 2013-11-02 NOTE — Telephone Encounter (Signed)
INR 2.7 PT 32.8. 2.5 M,W,F AND 5MG  T, TH, SAT AND SUN

## 2013-11-02 NOTE — Telephone Encounter (Signed)
See coumadin note. 

## 2013-11-19 ENCOUNTER — Other Ambulatory Visit: Payer: Self-pay | Admitting: Cardiology

## 2013-12-09 ENCOUNTER — Telehealth: Payer: Self-pay | Admitting: *Deleted

## 2013-12-09 ENCOUNTER — Ambulatory Visit (INDEPENDENT_AMBULATORY_CARE_PROVIDER_SITE_OTHER): Payer: Medicare Other | Admitting: *Deleted

## 2013-12-09 DIAGNOSIS — I4891 Unspecified atrial fibrillation: Secondary | ICD-10-CM

## 2013-12-09 DIAGNOSIS — Z5181 Encounter for therapeutic drug level monitoring: Secondary | ICD-10-CM

## 2013-12-09 LAB — POCT INR: INR: 2.4

## 2013-12-09 NOTE — Telephone Encounter (Signed)
INR 2.4 Pt 28.3

## 2013-12-10 NOTE — Telephone Encounter (Signed)
See coumadin note. 

## 2013-12-29 ENCOUNTER — Ambulatory Visit: Payer: Medicare Other | Admitting: Cardiology

## 2014-01-01 ENCOUNTER — Ambulatory Visit (INDEPENDENT_AMBULATORY_CARE_PROVIDER_SITE_OTHER): Payer: Medicare Other | Admitting: Cardiology

## 2014-01-01 ENCOUNTER — Telehealth: Payer: Self-pay | Admitting: Cardiology

## 2014-01-01 DIAGNOSIS — I4891 Unspecified atrial fibrillation: Secondary | ICD-10-CM

## 2014-01-01 DIAGNOSIS — Z5181 Encounter for therapeutic drug level monitoring: Secondary | ICD-10-CM

## 2014-01-01 LAB — POCT INR: INR: 3.3

## 2014-01-01 NOTE — Telephone Encounter (Signed)
CALLING IN INR   3.3 ANGIE WITH [facility]

## 2014-01-01 NOTE — Telephone Encounter (Signed)
Nathan Zimmerman  This looks like one of yours.  Enjoyed my day in [place] last week!  Belenda Cruise

## 2014-01-04 NOTE — Telephone Encounter (Signed)
See coumadin note. 

## 2014-01-20 ENCOUNTER — Telehealth: Payer: Self-pay | Admitting: *Deleted

## 2014-01-20 ENCOUNTER — Ambulatory Visit (INDEPENDENT_AMBULATORY_CARE_PROVIDER_SITE_OTHER): Payer: Medicare Other | Admitting: *Deleted

## 2014-01-20 DIAGNOSIS — I4891 Unspecified atrial fibrillation: Secondary | ICD-10-CM

## 2014-01-20 DIAGNOSIS — Z5181 Encounter for therapeutic drug level monitoring: Secondary | ICD-10-CM

## 2014-01-20 LAB — POCT INR: INR: 2.4

## 2014-01-20 NOTE — Telephone Encounter (Signed)
See coumadin note. 

## 2014-01-20 NOTE — Telephone Encounter (Signed)
INR 2.4.

## 2014-01-21 ENCOUNTER — Ambulatory Visit: Payer: Medicare Other | Admitting: Cardiology

## 2014-02-04 ENCOUNTER — Other Ambulatory Visit: Payer: Self-pay | Admitting: Cardiology

## 2014-02-17 ENCOUNTER — Other Ambulatory Visit (HOSPITAL_COMMUNITY): Payer: Self-pay | Admitting: Internal Medicine

## 2014-02-17 DIAGNOSIS — R042 Hemoptysis: Secondary | ICD-10-CM

## 2014-02-17 DIAGNOSIS — Z720 Tobacco use: Secondary | ICD-10-CM

## 2014-02-18 ENCOUNTER — Other Ambulatory Visit (HOSPITAL_COMMUNITY): Payer: Self-pay | Admitting: Internal Medicine

## 2014-02-19 ENCOUNTER — Ambulatory Visit (HOSPITAL_COMMUNITY): Admission: RE | Admit: 2014-02-19 | Payer: Medicare Other | Source: Ambulatory Visit

## 2014-02-24 ENCOUNTER — Ambulatory Visit: Payer: Medicare Other | Admitting: Cardiology

## 2014-03-01 ENCOUNTER — Ambulatory Visit (INDEPENDENT_AMBULATORY_CARE_PROVIDER_SITE_OTHER): Payer: Medicare Other | Admitting: *Deleted

## 2014-03-01 DIAGNOSIS — Z5181 Encounter for therapeutic drug level monitoring: Secondary | ICD-10-CM

## 2014-03-01 DIAGNOSIS — I4891 Unspecified atrial fibrillation: Secondary | ICD-10-CM

## 2014-03-01 LAB — POCT INR: INR: 5.8

## 2014-03-04 ENCOUNTER — Ambulatory Visit (INDEPENDENT_AMBULATORY_CARE_PROVIDER_SITE_OTHER): Payer: Medicare Other | Admitting: *Deleted

## 2014-03-04 DIAGNOSIS — I4891 Unspecified atrial fibrillation: Secondary | ICD-10-CM

## 2014-03-04 DIAGNOSIS — R042 Hemoptysis: Secondary | ICD-10-CM

## 2014-03-04 DIAGNOSIS — Z5181 Encounter for therapeutic drug level monitoring: Secondary | ICD-10-CM

## 2014-03-04 DIAGNOSIS — Z720 Tobacco use: Secondary | ICD-10-CM

## 2014-03-04 LAB — POCT INR: INR: 2

## 2014-03-17 ENCOUNTER — Telehealth: Payer: Self-pay | Admitting: Cardiology

## 2014-03-17 NOTE — Telephone Encounter (Signed)
Mrs. Magnifico called stating that her husband is under the care of Hospice now. Nathan Zimmerman has an appointment on Tuesday 03/23/14.  Hospice nurse is going to be checking his coumdin because he is so weak at this time. Please have  Dr. Diona Browner advise on what To do about his upcoming appointment.

## 2014-03-17 NOTE — Telephone Encounter (Signed)
Will forward to Dr. Diona Browner.  Will be back in office on Monday, 03/22/14.

## 2014-03-18 NOTE — Telephone Encounter (Signed)
Wife notified.

## 2014-03-18 NOTE — Telephone Encounter (Signed)
This appointment can be cancelled. Nurse checking Coumadin can forward INR to our Coumadin clinic.

## 2014-03-22 ENCOUNTER — Telehealth: Payer: Self-pay | Admitting: *Deleted

## 2014-03-22 NOTE — Telephone Encounter (Signed)
TC from [facility] w/  Hospice - requesting orders for how often you want them to check INR on this patient.

## 2014-03-23 ENCOUNTER — Ambulatory Visit: Payer: Medicare Other | Admitting: Cardiology

## 2014-03-24 NOTE — Telephone Encounter (Signed)
1/4  LM on voice mail that it is OK for her to check INR on pt at home and call results to me.  Ask her to call me back tomorrow to let me know she got this message and what day she plans to check pt.  1/6  LM on voice mail again to call me back re. Checking INR

## 2014-03-25 ENCOUNTER — Telehealth: Payer: Self-pay | Admitting: *Deleted

## 2014-03-25 ENCOUNTER — Ambulatory Visit (INDEPENDENT_AMBULATORY_CARE_PROVIDER_SITE_OTHER): Payer: Medicare Other | Admitting: *Deleted

## 2014-03-25 DIAGNOSIS — Z5181 Encounter for therapeutic drug level monitoring: Secondary | ICD-10-CM

## 2014-03-25 DIAGNOSIS — I4891 Unspecified atrial fibrillation: Secondary | ICD-10-CM

## 2014-03-25 LAB — PROTIME-INR: INR: 2.3 — AB (ref 0.9–1.1)

## 2014-03-25 NOTE — Telephone Encounter (Signed)
Received call back from [place] today.  She is drawing INR on Mr Bunner today and taking it to [facility] lab.  I will call her with orders and next INR date when I get the results.

## 2014-03-25 NOTE — Telephone Encounter (Signed)
Called.  See coumadin note. 

## 2014-03-25 NOTE — Telephone Encounter (Signed)
Please call Tad Moore or Melissa at [facility] # [phone] in regards to his coumdin check .

## 2014-05-04 ENCOUNTER — Telehealth: Payer: Self-pay

## 2014-05-04 NOTE — Telephone Encounter (Signed)
Patient died @ [facility] per Obituary °

## 2014-05-18 DEATH — deceased
